# Patient Record
Sex: Male | Born: 1950 | Race: White | Hispanic: No | Marital: Married | Smoking: Former smoker
Health system: Southern US, Community
[De-identification: ages and names within clinical notes are randomized; demographics above are authoritative.]

## PROBLEM LIST (undated history)

## (undated) DIAGNOSIS — I714 Abdominal aortic aneurysm, without rupture, unspecified: Secondary | ICD-10-CM

## (undated) DIAGNOSIS — R7989 Other specified abnormal findings of blood chemistry: Secondary | ICD-10-CM

## (undated) DIAGNOSIS — H3554 Dystrophies primarily involving the retinal pigment epithelium: Secondary | ICD-10-CM

## (undated) DIAGNOSIS — M1990 Unspecified osteoarthritis, unspecified site: Secondary | ICD-10-CM

## (undated) DIAGNOSIS — G8929 Other chronic pain: Secondary | ICD-10-CM

## (undated) DIAGNOSIS — I8390 Asymptomatic varicose veins of unspecified lower extremity: Secondary | ICD-10-CM

## (undated) DIAGNOSIS — R002 Palpitations: Secondary | ICD-10-CM

## (undated) DIAGNOSIS — Z87442 Personal history of urinary calculi: Secondary | ICD-10-CM

## (undated) DIAGNOSIS — H3530 Unspecified macular degeneration: Secondary | ICD-10-CM

## (undated) DIAGNOSIS — E119 Type 2 diabetes mellitus without complications: Secondary | ICD-10-CM

## (undated) DIAGNOSIS — M79673 Pain in unspecified foot: Secondary | ICD-10-CM

## (undated) DIAGNOSIS — M545 Other chronic pain: Secondary | ICD-10-CM

## (undated) DIAGNOSIS — K219 Gastro-esophageal reflux disease without esophagitis: Secondary | ICD-10-CM

## (undated) DIAGNOSIS — I10 Essential (primary) hypertension: Secondary | ICD-10-CM

## (undated) DIAGNOSIS — D751 Secondary polycythemia: Secondary | ICD-10-CM

## (undated) DIAGNOSIS — F419 Anxiety disorder, unspecified: Secondary | ICD-10-CM

## (undated) HISTORY — DX: Other chronic pain: G89.29

## (undated) HISTORY — DX: Anxiety disorder, unspecified: F41.9

## (undated) HISTORY — DX: Gastro-esophageal reflux disease without esophagitis: K21.9

## (undated) HISTORY — DX: Other chronic pain: M54.50

## (undated) HISTORY — DX: Secondary polycythemia: D75.1

## (undated) HISTORY — DX: Unspecified macular degeneration: H35.30

## (undated) HISTORY — DX: Abdominal aortic aneurysm, without rupture, unspecified: I71.40

## (undated) HISTORY — DX: Other specified abnormal findings of blood chemistry: R79.89

## (undated) HISTORY — PX: ROTATOR CUFF REPAIR: SHX139

## (undated) HISTORY — DX: Pain in unspecified foot: M79.673

## (undated) HISTORY — DX: Palpitations: R00.2

## (undated) HISTORY — PX: OTHER SURGICAL HISTORY: SHX169

## (undated) HISTORY — DX: Essential (primary) hypertension: I10

## (undated) HISTORY — PX: TYMPANOPLASTY: SHX33

## (undated) HISTORY — DX: Abdominal aortic aneurysm, without rupture: I71.4

## (undated) HISTORY — DX: Asymptomatic varicose veins of unspecified lower extremity: I83.90

---

## 1970-01-15 HISTORY — PX: OTHER SURGICAL HISTORY: SHX169

## 2004-01-04 ENCOUNTER — Ambulatory Visit: Payer: Self-pay | Admitting: Family Medicine

## 2004-01-05 ENCOUNTER — Ambulatory Visit: Payer: Self-pay | Admitting: Cardiology

## 2004-04-27 ENCOUNTER — Ambulatory Visit: Payer: Self-pay | Admitting: Family Medicine

## 2004-09-13 ENCOUNTER — Observation Stay (HOSPITAL_COMMUNITY): Admission: AD | Admit: 2004-09-13 | Discharge: 2004-09-15 | Payer: Self-pay | Admitting: Cardiology

## 2004-09-13 ENCOUNTER — Ambulatory Visit: Payer: Self-pay | Admitting: Cardiology

## 2004-09-15 ENCOUNTER — Encounter: Payer: Self-pay | Admitting: Cardiology

## 2004-09-20 ENCOUNTER — Ambulatory Visit: Payer: Self-pay | Admitting: Family Medicine

## 2004-10-13 ENCOUNTER — Ambulatory Visit: Payer: Self-pay | Admitting: Cardiology

## 2004-10-18 ENCOUNTER — Ambulatory Visit: Admission: RE | Admit: 2004-10-18 | Discharge: 2004-10-18 | Payer: Self-pay | Admitting: Cardiology

## 2004-11-07 ENCOUNTER — Ambulatory Visit: Payer: Self-pay | Admitting: Family Medicine

## 2004-11-16 ENCOUNTER — Ambulatory Visit: Payer: Self-pay | Admitting: Family Medicine

## 2004-12-04 ENCOUNTER — Ambulatory Visit: Payer: Self-pay | Admitting: Family Medicine

## 2004-12-13 ENCOUNTER — Ambulatory Visit: Payer: Self-pay | Admitting: Internal Medicine

## 2004-12-27 ENCOUNTER — Ambulatory Visit: Payer: Self-pay | Admitting: Internal Medicine

## 2004-12-27 ENCOUNTER — Ambulatory Visit (HOSPITAL_COMMUNITY): Admission: RE | Admit: 2004-12-27 | Discharge: 2004-12-27 | Payer: Self-pay | Admitting: Internal Medicine

## 2005-01-15 HISTORY — PX: ABDOMINAL AORTIC ANEURYSM REPAIR: SUR1152

## 2005-01-16 ENCOUNTER — Ambulatory Visit: Payer: Self-pay | Admitting: Family Medicine

## 2005-03-07 ENCOUNTER — Ambulatory Visit: Payer: Self-pay | Admitting: Family Medicine

## 2005-03-26 ENCOUNTER — Encounter: Admission: RE | Admit: 2005-03-26 | Discharge: 2005-03-26 | Payer: Self-pay | Admitting: Vascular Surgery

## 2005-07-11 ENCOUNTER — Ambulatory Visit: Payer: Self-pay | Admitting: Family Medicine

## 2005-11-29 DIAGNOSIS — C4491 Basal cell carcinoma of skin, unspecified: Secondary | ICD-10-CM

## 2005-11-29 HISTORY — DX: Basal cell carcinoma of skin, unspecified: C44.91

## 2006-01-15 HISTORY — PX: ABDOMINAL AORTIC ANEURYSM REPAIR: SUR1152

## 2006-04-23 ENCOUNTER — Ambulatory Visit: Payer: Self-pay | Admitting: Family Medicine

## 2006-07-12 ENCOUNTER — Encounter: Admission: RE | Admit: 2006-07-12 | Discharge: 2006-07-12 | Payer: Self-pay | Admitting: Orthopedic Surgery

## 2006-07-25 ENCOUNTER — Ambulatory Visit (HOSPITAL_COMMUNITY): Admission: RE | Admit: 2006-07-25 | Discharge: 2006-07-26 | Payer: Self-pay | Admitting: Orthopedic Surgery

## 2006-10-25 ENCOUNTER — Ambulatory Visit: Payer: Self-pay | Admitting: Vascular Surgery

## 2007-03-11 ENCOUNTER — Encounter: Admission: RE | Admit: 2007-03-11 | Discharge: 2007-06-09 | Payer: Self-pay | Admitting: Orthopedic Surgery

## 2007-05-06 ENCOUNTER — Ambulatory Visit: Payer: Self-pay | Admitting: Vascular Surgery

## 2007-06-10 ENCOUNTER — Encounter: Admission: RE | Admit: 2007-06-10 | Discharge: 2007-08-19 | Payer: Self-pay | Admitting: Orthopedic Surgery

## 2010-05-30 NOTE — Op Note (Signed)
Peter [name], Peter [name]               ACCOUNT NO.:  [id number]   MEDICAL RECORD NO.:  [id number]          PATIENT TYPE:  AMB   LOCATION:  DAY                          FACILITY:  [facility]   PHYSICIAN:  Georges Lynch. Gioffre, M.D.DATE OF BIRTH:  [birth year]   DATE OF PROCEDURE:  07/25/2006  DATE OF DISCHARGE:                               OPERATIVE REPORT   SURGEON:  Georges Lynch. Darrelyn Hillock, M.D.   ASSISTANT:  Jamelle Rushing, P.A.   PREOP DIAGNOSIS:  Complete retracted tear of the right rotator cuff  tendon.   POSTOP DIAGNOSIS:  Complete retracted tear of the right rotator cuff  tendon.   OPERATION:  1. A partial acromionectomy and an acromioplasty, right shoulder.  2. A repair of a complete retracted tear of the rotator cuff tendon      utilizing a Restore tendon graft with two 4-prong Mitek metal      anchors.   PROCEDURE:  The patient first had an interscalene nerve block; then he  was brought back to surgery.  A sterile prepping and draping of the  right shoulder was carried out.  He had 1 gram of IV Ancef preop.  At  this time an incision was made over the anterior aspect of the right  shoulder.  Bleeders were identified and cauterized.  I separated the  deltoid tendon from the acromion in the usual fashion by sharp  dissection.  I then went down and identified the acromion.  His acromion  was very thickened and overgrown and downsloping.  I protected the  underlying rotator cuff with a Bennett retractor; and then did a partial  acromionectomy with the oscillating saw and then utilized the bur to  even out the undersurface of the acromion.  Following that I thoroughly  irrigated out the area.  I identified the cuff, the cup was completely  torn; it was a severe highlight tear with retraction.  I utilized a bur  to bur down the lateral articular surface of the humeral head.  Two  anchors were placed in the proximal humerus.  They were 4-prong Mitek  anchors.  I then grasped both ends of the  tendon, medial and lateral,  brought those together and sutured those first.  I did a primary  suturing repair.  Following that I then utilized the restore graft to  oversew the repair site and then anchored that down with the suture  anchors.   I thoroughly irrigated out the area.  Gelfoam was placed in the  shoulder.  I then reattached the deltoid tendon and muscle in the usual  fashion.  The skin was closed with metal staples.  A sterile Neosporin  dressing was applied.  He was placed in the shoulder immobilizer.           ______________________________  Georges Lynch Darrelyn Hillock, M.D.     RAG/MEDQ  D:  07/25/2006  T:  07/26/2006  Job:  [id number]

## 2010-05-30 NOTE — Procedures (Signed)
DUPLEX ULTRASOUND OF ABDOMINAL AORTA   INDICATION:  Followup, abdominal aortic aneurysm.   HISTORY:  Diabetes:  No.  Cardiac:  No.  Hypertension:  Yes.  Smoking:  Quit.  Connective Tissue Disorder:  Family History:  Yes.  Previous Surgery:  No.   DUPLEX EXAM:         AP (cm)                   TRANSVERSE (cm)  Proximal             3.96 Cm                   4.0 cm  Mid                  5.05 cm                   5.15 cm  Distal               2.00 cm                   2.15 cm  Right Iliac          1.20 cm                   1.19 cm  Left Iliac           1.28 cm                   1.13 cm   PREVIOUS:  Date: 09/24/2005  AP:  4.3  TRANSVERSE:  4.4.   IMPRESSION:  1. Abdominal aortic aneurysm measurements have increased from previous      study.  2. Proximal aorta was technically difficult to image due to bowel gas.  3. Dr. Arbie Cookey is seeing the patient today.   ___________________________________________  Larina Earthly, M.D.   AS/MEDQ  D:  10/25/2006  T:  10/26/2006  Job:  [id number]

## 2010-05-30 NOTE — Assessment & Plan Note (Signed)
OFFICE VISIT   Peter [name], Peter [name]  DOB:  [birth year]                                       10/25/2006  ZOXWR#:[id number]   Patient presents today for continued followup of his known infrarenal  abdominal aortic aneurysm.  He has had no symptoms related to this.  He  has multiple new medical problems.  He has continued difficulty with  vision and now is being seen at the [facility] for  hemoglobin of 19.  Also reports that his testosterone level is extremely  low and has begun treatment with topical testosterone.  He had just  generalized malaise.   PHYSICAL EXAMINATION:  This is a well-developed and well-nourished white  male appearing his stated age of 70.  Blood pressure is 151/100, pulse  77, respirations 18, 97% oxygen saturation.  He has stiffness in his  right shoulder and is unable to raise this due to an injury.  His  abdominal exam reveals no tenderness.  He does have a prominent aortic  pulsation.  He has 2+ femoral, 2+ popliteal, and 2+ dorsalis pedis  pulses bilaterally.   He underwent a repeat noninvasive vascular laboratory study today, and  this reveals ultrasound of his aorta with increase in size.   His last ultrasound one year ago was maximum diameter of 4.4 cm.  Today  it is a maximum diameter of 5.1 cm.  I discussed this at length with  patient.  I explained that this does put him at increased risk for  rupture of his aneurysm.  He reports with all of his other medical  issues, he is unable to proceed with any consideration of elective  repair at this time.  I explained that his annual risk would be  approximately 5% for aneurysm rupture with his 5 cm aneurysm.  I did  explain that he has shown grown over one year.  He will see me again at  six months for repeat ultrasound.  I explained that if he does show  significant increased growth over that time, that we would strongly  recommend repair.  I again reviewed symptoms of  leaking aneurysm with  him,  and he will notify me should this occur.  Otherwise, we will see him in  six months with repeat ultrasound.   Larina Earthly, M.D.  Electronically Signed   TFE/MEDQ  D:  10/25/2006  T:  10/28/2006  Job:  [id number]   cc:   Delaney Meigs, M.D.  Boris M. Darovsky, M.D.

## 2010-06-02 NOTE — Discharge Summary (Signed)
[name], [name] NO.:  [id number]   MEDICAL RECORD NO.:  [id number]          PATIENT TYPE:  INP   LOCATION:  3711                         FACILITY:  [facility]   PHYSICIAN:  Jonelle Sidle, M.D. LHCDATE OF BIRTH:  June 16, 1950   DATE OF ADMISSION:  09/13/2004  DATE OF DISCHARGE:  09/15/2004                           DISCHARGE SUMMARY - REFERRING   HISTORY OF PRESENT ILLNESS:  Peter [name] is a 60 year old white male who was  transferred from [facility] for evaluation of chest discomfort.  He  described a three day history of an annoying discomfort he described as a  fullness in his upper chest which was intermittent, worse with activity,  associated with diaphoresis, shortness of breath, nausea, light headedness,  with blurred vision.  He has also noticed some dysphagia for the last three  months without any signs of increased reflux.   PAST MEDICAL HISTORY:  Notable for anxiety, vertigo, kidney stones, gout,  palpitations, hypertension since age 68, past abdominal aortic aneurysm, and  tobacco use.   LABORATORY DATA:  Chest x-ray on August 30 showed left basilar atelectasis.  Ultrasound of the abdomen showed abdominal aortic aneurysm with maximum AP  and transverse diameter of 3.4 by 4.5 cm, length was approximately 8.2, both  common iliacs were dilated at 1.3.  I called [facility] for comparison with their  last duplex scan that was performed in 2005.  At that time, it measured 3.4  by 3.59 with right iliac of 1.01 and left iliac of 1.23.  EKG showed sinus  bradycardia, normal axis, early repolarization.  Labs at [facility]  showed H&H of 17 and 49.4, normal indices, platelets 285, WBC 7.7.  Sodium  137, potassium 3.5, BUN 14, creatinine 1.1, glucose 100.  PT 12.5, PTT 28.6.  Initial CK, MB, relative index, and troponin were negative.   [facility] admission weight was 221 pounds, subsequent H&H were  unremarkable, as were his PT and PTT.   D-dimer on August 31 was less than  0.22.  On transfer on August 30, potassium was low at 3.9, he also had  normal LFTs.  On the morning of discharge, sodium is 139, potassium 3.6, BUN  13, creatinine 1.2.  Hemoglobin A1C was 5.5.  Subsequent CK, MB, relative  index, and troponins were negative x 2.  Fasting lipids showed a total  cholesterol 164, triglycerides 183, HDL 28, LDL 99, TSH 1.142.   HOSPITAL COURSE:  Peter [name] was admitted to 3700.  Overnight, he did not  have any further chest discomfort and he had ruled out for myocardial  infarction.  Catheterization performed on August 31 by Dr. Riley Kill revealed  a normal ejection fraction and a 20-30% proximal LAD, moderate to large  abdominal aortic aneurysm was also visualized.  Dr. Riley Kill advised that the  patient should discontinue smoking.  Post catheterization, the patient was  ambulating without difficulty and the catheterization site was intact.  Prior to discharge, an echocardiogram was performed.  This revealed an  ejection fraction of 50-65%, findings suggestive of inferoposterior  hypokinesis, mild LVH, mild MR, bilateral mild  atrial enlargement, mild  right ventricular enlargement.  Abdominal ultrasound as previously described  was also performed.  The echocardiogram and abdominal ultrasound were  reviewed with Dr. Diona Browner and it was felt that the patient could be  discharged home.   DISCHARGE DIAGNOSIS:  1.  Atypical chest discomfort with nonobstructive coronary artery disease on      cardiac catheterization and a normal ejection fraction by      catheterization and echocardiogram.  2.  Hyperlipidemia.  3.  Tobacco use.  4.  Abdominal aortic aneurysm.  5.  Dysphagia.  6.  History as previous.   DISPOSITION:  The patient is discharged home.   DISCHARGE MEDICATIONS:  Aspirin 81 mg daily, Hyzaar 50/12.5 daily,  Wellbutrin 50 daily, Meclizine as needed.  New medications include Zocor 20  mg q.h.s. and Protonix 40 mg  p.o. daily.  He will follow up with Dr.  Diona Browner in the [place] office on October 2 at 2 p.m.  He was advised no  smoking.  He also received discharge instructions after cardiac  catheterization in regards to activities and slight care.  Just prior to  discharge, he also received written material about discontinuing smoking and  hyperlipidemia.  At the time of follow up with Dr. Diona Browner, consideration  should be given to rechecking fasting lipids and LFTs in approximately eight  weeks since his discharge and to reinforce tobacco cessation.      Peter [name], P.A. [facility]    ______________________________  Jonelle Sidle, M.D. [facility]    EW/MEDQ  D:  09/15/2004  T:  09/15/2004  Job:  [id number]   cc:   Peter [name], M.D.  [address]  [place]  [place] [address]   Peter [name], M.D.  [address]  [place]  [place] [address]  Fax: [phone]

## 2010-06-02 NOTE — H&P (Signed)
Peter [name], Peter [name]               ACCOUNT NO.:  [id number]   MEDICAL RECORD NO.:  [id number]          PATIENT TYPE:  AMB   LOCATION:                                FACILITY:  [facility]   PHYSICIAN:  R. Roetta Sessions, M.D.      DATE OF BIRTH:   DATE OF ADMISSION:  DATE OF DISCHARGE:  LH                                HISTORY & PHYSICAL   REASON FOR CONSULTATION:  Chest pain and dysphagia.   HISTORY OF PRESENT ILLNESS:  This patient is a pleasant, 60 year old  Caucasian male sent over at the courtesy of [facility] in [place] to further evaluate intermittent chest pain and some  typical reflux symptoms. He ultimately was seen by [facility] and  recently underwent a catheterization; and he was basically found to have no  significant coronary disease, per his report.   He does have a long history of more typically of gastroesophageal reflux  disease symptoms and has had some bleeding with retrosternal chest pain  which is different from reflux, recently.  He also describes chronic  esophageal dysphagia to solids, and has had transient food impactions  particularly when he eats out from time-to-time, overall, over the last  several years.  He has never had any imaging of his upper GI tract in the  way of barium studies or upper endoscopy.  He does not have odynophagia.  He  does not use alcohol or tobacco (he quit smoking several months ago).  He  occasionally has some typical regurgitation but does not have much in the  way of typical heartburn.  He had been on Prilosec, but the cardiologist put  him on Nexium 40 mg orally b.i.d. a couple of weeks ago.   PAST MEDICAL HISTORY:  1.  Significant for hypertension.  2.  Chronic ear problems.  3.  History of a AAA, reportedly small.  He is being followed by Dr. Gretta Began.   PAST SURGERIES:  Bilateral mastoid surgery and cardiac catheterization and  kidney stone surgery.  He has seen Dr. Jerre Simon  previously.   He tells me that he was hospitalized for two weeks in a row in [place] some  25 years ago with nausea, vomiting, diarrhea, weight loss and was finally  diagnosed with Giardia.   CURRENT MEDICATIONS:  1.  Over-the-counter __________ and B50 daily, herbal vitamin supplements,      fish oil 300 mg daily, __________ 150 mg daily.  2.  Zoloft 50 mg 1/2 tablet daily.  3.  Lorazepam 0.5 mg q.h.s.  4.  Zocor once daily.  5.  Nexium 40 mg orally b.i.d.  6.  Hyzaar 100/25 one-half tablet daily.  7.  ASA 81 mg daily.  8.  Erythromycin 500 mg orally b.i.d..  He has one more tablet to finish his      therapy.   ALLERGIES:  PENICILLIN.   FAMILY HISTORY:  All of his siblings have had some sort of heart disease,  catheterization, bypass surgery  No history of chronic GI or liver illness.  SOCIAL HISTORY:  The patient is married and has two children.  He works for  [employer].  He is a former smoker, no alcohol.   REVIEW OF SYSTEMS:  No recent chest pain on exertion.  No dyspnea.  No fever  or chills.  Otherwise as in history of present illness.   PHYSICAL EXAMINATION:  GENERAL:  Reveals a pleasant, 60 year old gentleman  resting comfortably.  VITAL SIGNS:  Weight 225. Height 6 feet 2 inches.  Temperature 97.6, BP  130/80, pulse 68.  SKIN:  Warm and dry.  No jaundice.  No cutaneous stigmata of chronic liver  disease.  HEENT:  No scleral icterus.  NECK:  JVD is not prominent.  CHEST:  Lungs are clear to auscultation.  CARDIAC:  Regular rate and rhythm without murmur, gallop, or rub.  ABDOMEN:  Nondistended, positive bowel sounds.  Soft, nontender, without  appreciable mass or organomegaly.  EXTREMITIES:  Have no edema.   IMPRESSION:  This patient is a 60 year old gentleman with a history of  background symptoms consistent with gastroesophageal reflux disease, some  atypical chest pain recently.  It is good to know that cardiac  catheterization was pretty much  okay.   He describes esophageal dysphagia to solids which has been ongoing for some  time which likely is representative of a ring, stricture, or possibly a web.  His typical reflux symptoms have been pretty well squelched on Nexium 40 mg  orally b.i.d.   RECOMMENDATIONS:  The patient needs an EGD to further evaluate his dysphagia  and atypical chest pain.  This approach has been discussed with the patient  and the potential risks, benefits, and alternatives have been reviewed,  questions answered, he is agreeable.  We will plan to perform an EGD with  possible esophageal diltation in the very near future.  We will make further  recommendations after endoscopic evaluation has taken place.      Jonathon Bellows, M.D.  Electronically Signed     RMR/MEDQ  D:  12/13/2004  T:  12/13/2004  Job:  [id number]   cc:   Delaney Meigs, M.D.  Fax: [phone]

## 2010-06-02 NOTE — Cardiovascular Report (Signed)
NAMEALEXUS, Peter [name] NO.:  [id number]   MEDICAL RECORD NO.:  [id number]          PATIENT TYPE:  INP   LOCATION:  3711                         FACILITY:  [facility]   PHYSICIAN:  Arturo Morton. Riley Kill, M.D. [facility] OF BIRTH:  05-16-1950   DATE OF PROCEDURE:  09/14/2004  DATE OF DISCHARGE:                              CARDIAC CATHETERIZATION   INDICATIONS:  The patient has had significant shortness of breath and chest  pain.  He presents for evaluation.  He was seen by Dr. Diona Browner and set up  for cardiac catheterization.   PROCEDURE:  1.  Left heart catheterization.  2.  Selective coronary arteriography.  3.  Selective left ventriculography.  4.  Distal aortography.   DESCRIPTION OF PROCEDURE:  The patient was brought to the catheterization  laboratory after informed consent.  He was prepped and draped in the usual  fashion.  Through an puncture, the right femoral artery was easily entered  and a 6-French sheath was placed.  Views of the left and right coronary  arteries were obtained in multiple angiographic projections.  Central aortic  and left ventricular pressures were measured with a pigtail.  Ventriculography was performed in the RAO projection.  Distal aortography  was then performed to evaluate his abdominal aortic aneurysm that is known.   He tolerated the procedure well and was taken to the holding area in  satisfactory clinical condition.  ACT was appropriate for sheath removal.   HEMODYNAMIC DATA:  1.  Central aortic pressure 123/81, mean 100.  2.  Left ventricular pressure 110/14.  3.  No gradient on pullback across the aortic valve.   ANGIOGRAPHIC DATA:  1.  Ventriculography was performed in the RAO projection.  Post PVC beats      were used to analyze overall LV function.  Overall systolic function      appeared to be well-preserved.  There did not appear to be significant      mitral regurgitation noted.  2.  Distal aortography revealed what  appeared to be patent renal arteries.      There is a moderate the large abdominal aortic aneurysm that extends and      ends before the bifurcation.  3.  The left main is free of critical disease.  4.  The LAD has about 20% to 30% segmental narrowing beyond the origin of      the diagonal.  The diagonal is large.  The distal LAD is without      critical disease.  5.  The circumflex provides a large marginal without significant narrowing.      The right coronary provides a small PDA and posterolateral system.      There is mild luminal irregularity throughout the RCA, but without      critical narrowing.   CONCLUSIONS:  1.  Well-preserved left ventricular function.  2.  Mild irregularity of the mid left anterior descending artery without      critical focal narrowing.  3.  No critical disease of the circumflex or right coronary artery.  4.  Moderate-sized abdominal aortic aneurysm.  PLAN:  1.  Discontinue smoking.  2.  Check D-dimer.  3.  Discontinue heparin.  4.  Two-dimensional echocardiogram.  5.  Get chest x-ray results.  6.  Follow up with Dr. Diona Browner and Dr. Lysbeth Galas.      Arturo Morton. Riley Kill, M.D. [facility]  Electronically Signed     TDS/MEDQ  D:  09/14/2004  T:  09/14/2004  Job:  [id number]

## 2010-06-02 NOTE — Assessment & Plan Note (Signed)
OFFICE VISIT   Peter [name], Peter [name]  DOB:  Nov 12, 1950                                       11/29/2006  CHART#:[id number]   I left a message with the patient's answering machine at his home and I  have attempted to contact him at work today on 11/14.  He had contacted  our office while I was gone on vacation last week stating he had an  ultrasound performed at [facility] with an aneurysm measuring 9.4  cm.  Dr. Darrick Penna was asked to review this in my absence, and this  actually showed that the length was 9.4 but the diameter was 5.7x6.0.  He was explained that this was not urgent and had been suggested that he  follow up with me for further discussion.  Apparently, he has discussed  with our nursing staff and [facility] that he may schedule an appointment at  [facility] for further evaluation.  I attempted to discuss this with him  today.  I left a message and asked him to call us, explained that we  would recommend a CAT scan to explain the discrepancy of the [facility]  study of a 6 cm diameter versus our study on 10/25/2006 suggesting a 5.1  cm diameter aneurysm.  He has no symptoms referable to his aorta, and we  will attempt to contact him for further discussion.   Larina Earthly, M.D.  Electronically Signed   TFE/MEDQ  D:  11/29/2006  T:  12/02/2006  Job:  [id number]

## 2010-09-26 ENCOUNTER — Ambulatory Visit: Payer: PRIVATE HEALTH INSURANCE | Attending: Neurology | Admitting: Physical Therapy

## 2010-09-26 DIAGNOSIS — IMO0001 Reserved for inherently not codable concepts without codable children: Secondary | ICD-10-CM | POA: Insufficient documentation

## 2010-09-26 DIAGNOSIS — M25676 Stiffness of unspecified foot, not elsewhere classified: Secondary | ICD-10-CM | POA: Insufficient documentation

## 2010-09-26 DIAGNOSIS — M25673 Stiffness of unspecified ankle, not elsewhere classified: Secondary | ICD-10-CM | POA: Insufficient documentation

## 2010-09-26 DIAGNOSIS — R5381 Other malaise: Secondary | ICD-10-CM | POA: Insufficient documentation

## 2010-09-26 DIAGNOSIS — M25579 Pain in unspecified ankle and joints of unspecified foot: Secondary | ICD-10-CM | POA: Insufficient documentation

## 2010-10-02 ENCOUNTER — Ambulatory Visit: Payer: PRIVATE HEALTH INSURANCE | Admitting: Physical Therapy

## 2010-10-06 ENCOUNTER — Ambulatory Visit: Payer: PRIVATE HEALTH INSURANCE | Admitting: *Deleted

## 2010-10-10 ENCOUNTER — Ambulatory Visit: Payer: PRIVATE HEALTH INSURANCE | Admitting: *Deleted

## 2010-10-13 ENCOUNTER — Ambulatory Visit: Payer: PRIVATE HEALTH INSURANCE | Admitting: *Deleted

## 2010-10-17 ENCOUNTER — Ambulatory Visit: Payer: Medicare Other | Attending: Neurology | Admitting: Physical Therapy

## 2010-10-17 DIAGNOSIS — M25676 Stiffness of unspecified foot, not elsewhere classified: Secondary | ICD-10-CM | POA: Insufficient documentation

## 2010-10-17 DIAGNOSIS — R5381 Other malaise: Secondary | ICD-10-CM | POA: Insufficient documentation

## 2010-10-17 DIAGNOSIS — M25673 Stiffness of unspecified ankle, not elsewhere classified: Secondary | ICD-10-CM | POA: Insufficient documentation

## 2010-10-17 DIAGNOSIS — IMO0001 Reserved for inherently not codable concepts without codable children: Secondary | ICD-10-CM | POA: Insufficient documentation

## 2010-10-17 DIAGNOSIS — M25579 Pain in unspecified ankle and joints of unspecified foot: Secondary | ICD-10-CM | POA: Insufficient documentation

## 2010-10-23 ENCOUNTER — Other Ambulatory Visit: Payer: Self-pay

## 2010-10-23 DIAGNOSIS — I83893 Varicose veins of bilateral lower extremities with other complications: Secondary | ICD-10-CM

## 2010-10-27 ENCOUNTER — Encounter: Payer: Self-pay | Admitting: Vascular Surgery

## 2010-10-31 LAB — BASIC METABOLIC PANEL
BUN: 9
CO2: 28
Calcium: 10
Chloride: 101
Creatinine, Ser: 0.95
GFR calc Af Amer: >60
GFR calc non Af Amer: >60
Glucose, Bld: 120 — ABNORMAL HIGH
Potassium: 3.9
Sodium: 138

## 2010-10-31 LAB — HEMOGLOBIN AND HEMATOCRIT, BLOOD
HCT: 52.9 — ABNORMAL HIGH
Hemoglobin: 18.4 — ABNORMAL HIGH

## 2010-10-31 LAB — APTT: aPTT: 31

## 2010-10-31 LAB — PROTIME-INR
INR: 1
Prothrombin Time: 12.8

## 2010-12-14 ENCOUNTER — Encounter: Payer: Self-pay | Admitting: Vascular Surgery

## 2010-12-15 ENCOUNTER — Ambulatory Visit (INDEPENDENT_AMBULATORY_CARE_PROVIDER_SITE_OTHER): Payer: PRIVATE HEALTH INSURANCE | Admitting: Vascular Surgery

## 2010-12-15 ENCOUNTER — Encounter: Payer: Self-pay | Admitting: Vascular Surgery

## 2010-12-15 VITALS — BP 153/93 | HR 70 | Resp 16 | Ht 73.0 in | Wt 239.0 lb

## 2010-12-15 DIAGNOSIS — I872 Venous insufficiency (chronic) (peripheral): Secondary | ICD-10-CM

## 2010-12-15 DIAGNOSIS — I83893 Varicose veins of bilateral lower extremities with other complications: Secondary | ICD-10-CM

## 2010-12-15 DIAGNOSIS — M7989 Other specified soft tissue disorders: Secondary | ICD-10-CM

## 2010-12-15 DIAGNOSIS — M79609 Pain in unspecified limb: Secondary | ICD-10-CM

## 2010-12-15 NOTE — Progress Notes (Signed)
[facility] [place]  Referred by:  Harlon Ditty Nyland [address] [facility] [place], [place] [address]  Reason for referral: Bilateral painful swollen legs  History of Present Illness  Peter [name] is a 60 y.o. male who presents with chief complaint: swollen leg.  Patient notes, onset of swelling years ago, associated with continued ambulation.  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  He has a bursting sensation as the day progresses.  he patient has had no history of DVT, known history of varicose vein, no history of venous stasis ulcers, no history of  Lymphedema and history of skin changes in lower legs.  There is no family history of venous disorders.  The patient has had used compression stockings in the past.  Past Medical History  Diagnosis Date  . AAA (abdominal aortic aneurysm)   . Macular degeneration   . Anxiety   . GERD (gastroesophageal reflux disease)   . Palpitations   . Foot pain   . Varicose veins     Past Surgical History  Procedure Date  . Abdominal aortic aneurysm repair 2008  . Rotator cuff repair     History   Social History  . Marital Status: Married    Spouse Name: N/A    Number of Children: N/A  . Years of Education: N/A   Occupational History  . Not on file.   Social History Main Topics  . Smoking status: Never Smoker   . Smokeless tobacco: Not on file  . Alcohol Use:   . Drug Use:   . Sexually Active:    Other Topics Concern  . Not on file   Social History Narrative  . No narrative on file    No family history on file.  Current Outpatient Prescriptions on File Prior to Visit  Medication Sig Dispense Refill  . amLODipine-olmesartan (AZOR) 5-20 MG per tablet Take 1 tablet by mouth daily.        . hydrochlorothiazide (MICROZIDE) 12.5 MG capsule Take 12.5 mg by mouth daily.        Marland Kitchen LORazepam (ATIVAN) 0.5 MG tablet Take 0.5 mg by mouth  every 8 (eight) hours.        . Omeprazole Magnesium (PRILOSEC OTC PO) Take by mouth.        . sertraline (ZOLOFT) 50 MG tablet Take 50 mg by mouth daily.        . Cholecalciferol (VITAMIN D) 2000 UNITS CAPS Take 1 capsule by mouth daily.          Allergies  Allergen Reactions  . Penicillins      Review of Systems (Positive items checked otherwise negative)  General: [ ]  Weight loss, [x]  Weight gain, [ ]   Loss of appetite, [ ]  Fever  Neurologic: [ ]  Dizziness, [ ]  Blackouts, [ ]  Headaches, [ ]  Seizure  Ear/Nose/Throat: [x]  Change in eyesight, [ ]  Change in hearing, [ ]  Nose bleeds, [ ]  Sore throat  Vascular: [x]  Pain in legs with walking, [ ]  Pain in feet while lying flat, [ ]  Non-healing ulcer, Stroke, [ ]  "Mini stroke", [ ]  Slurred speech, [ ]  Temporary blindness, [ ]  Blood clot in vein, [ ]  Phlebitis  Pulmonary: [ ]  Home oxygen, [ ]  Productive cough, [ ]  Bronchitis, [ ]  Coughing up blood,  [ ]  Asthma, [ ]  Wheezing  Musculoskeletal: [x]  Arthritis, [x]  Joint pain, [x]  Muscle pain  Cardiac: [ ]   Chest pain, [ ]  Chest tightness/pressure, [ ]  Shortness of breath when lying flat, [ ]  Shortness of breath with exertion, [ ]  Palpitations, [ ]  Heart murmur, [ ]  Arrythmia,  [ ]  Atrial fibrillation  Hematologic: [ ]  Bleeding problems, [ ]  Clotting disorder, [ ]  Anemia  Psychiatric:  [ ]  Depression, [x]  Anxiety, [ ]  Attention deficit disorder  Gastrointestinal:  [ ]  Black stool,[ ]   Blood in stool, [ ]  Peptic ulcer disease, [ ]  Reflux, [ ]  Hiatal hernia, [ ]  Trouble swallowing, [ ]  Diarrhea, [ ]  Constipation  Urinary:  [ ]  Kidney disease, [ ]  Burning with urination, [ ]  Frequent urination, [ ]  Difficulty urinating  Skin: [ ]  Ulcers, [ ]  Rashes   Physical Examination  Filed Vitals:   12/15/10 1041  BP: 153/93  Pulse: 70  Resp: 16  Height: 6\' 1"  (1.854 m)  Weight: 239 lb (108.41 kg)  SpO2: 95%   Body mass index is 31.53 kg/(m^2).   General: A&O x 3, WDWN, mildly  obese  Head: [place]/AT  Ear/Nose/Throat: Hearing grossly intact, nares w/o erythema or drainage, oropharynx w/o Erythema/Exudate  Eyes: PERRLA, EOMI  Neck: Supple, no nuchal rigidity, no palpable LAD  Pulmonary: Sym exp, good air movt, CTAB, no rales, rhonchi, & wheezing  Cardiac: RRR, Nl S1, S2, no Murmurs, rubs or gallops  Vascular: Vessel Right Left  Radial Palpable Palpable  Brachial Palpable Palpable  Carotid Palpable, without bruit Palpable, without bruit  Aorta Non-palpable N/A  Femoral Palpable Palpable  Popliteal Non-palpable Non-palpable  PT Palpable Palpable  DP Palpable Palpable   Gastrointestinal: soft, NTND, -G/R, - HSM, - masses, - CVAT B  Musculoskeletal: M/S 5/5 throughout , Extremities without ischemic changes , extensive large varicosities in both legs (L>>R), extends to posterior calf on left side, mild lipodermatosclerosis bilaterally  Neurologic: CN 2-12 intact , Pain and light touch intact in extremities , Motor exam as listed above  Psychiatric: Judgment intact, Mood & affect appropriate for pt's clinical situation  Dermatologic: See M/S exam for extremity exam, no rashes otherwise noted  Lymph : No Cervical, Axillary, or Inguinal lymphadenopathy   Non-Invasive Vascular Imaging  BLE Venous Insufficiency Duplex (Date: 12/15/10):   RLE: reflux in knee and prox GSV, normal deep system  LLE: reflux throughout L GSV and LSV system, normal deep system  Medical Decision Making  Peter [name] is a 60 y.o. male who presents with: bilateral symptomatic chronic venous insufficienct (C4) not responsive to compression therapy.   Based on the patient's history and examination, I recommend: evaluation for endovenous ablation +/- stab phlebectomy L GSV (pt req Dr. Arbie Cookey).  I will arrange for evaluation in the vein clinic with Dr. Arbie Cookey  Thank you for allowing [name] to participate in this patient's care.  Leonides Sake, MD [facility]  [place] Office: [phone] Pager: [phone]  12/15/2010, 6:40 PM

## 2010-12-15 NOTE — Progress Notes (Signed)
BLE venous reflux duplex performed 12/15/2010@[facility] 

## 2010-12-18 NOTE — Procedures (Unsigned)
LOWER EXTREMITY VENOUS REFLUX EXAM  INDICATION:  Varicose veins, pain, swelling.  EXAM:  Using color-flow imaging and pulse Doppler spectral analysis, the bilateral common femoral, superficial femoral, popliteal, posterior tibial, greater and lesser saphenous veins are evaluated.  There is no evidence suggesting deep venous insufficiency in the bilateral lower extremities.  The bilateral saphenofemoral junction are not competent with reflux of >549milliseconds. The bilateral GSV's are not competent with reflux of >544milliseconds with the caliber as described below.   The right proximal short saphenous vein demonstrates competency.  The left proximal short saphenous vein demonstrates incompetency with diameter measurements ranging from 0.62 cm to 0.83 cm.  GSV Diameter (used if found to be incompetent only)                                                   Right      Left Proximal Greater Saphenous Vein                   0.77 cm    1.22 cm Proximal-to-mid-thigh                             0.68 cm    1.03 cm Mid thigh                                         0.59 cm    0.88 cm Mid-distal thigh Distal thigh                                      0.48 cm    0.71 cm Knee                                              0.53 cm    0.83 cm  IMPRESSION: 1. Bilateral great saphenous veins are not competent with reflux     >523milliseconds. 2. The bilateral great saphenous veins are not tortuous. 3. The deep venous system bilaterally is competent. 4. The right small saphenous vein is competent. 5. The left small saphenous vein is not competent with reflux >500     milliseconds.  ___________________________________________ Fransisco Hertz, MD  SH/MEDQ  D:  12/15/2010  T:  12/15/2010  Job:  [id number]

## 2010-12-26 ENCOUNTER — Other Ambulatory Visit: Payer: Self-pay | Admitting: *Deleted

## 2010-12-26 ENCOUNTER — Encounter (INDEPENDENT_AMBULATORY_CARE_PROVIDER_SITE_OTHER): Payer: PRIVATE HEALTH INSURANCE

## 2010-12-26 DIAGNOSIS — I83893 Varicose veins of bilateral lower extremities with other complications: Secondary | ICD-10-CM

## 2011-01-23 ENCOUNTER — Ambulatory Visit: Payer: PRIVATE HEALTH INSURANCE | Admitting: Vascular Surgery

## 2011-03-26 ENCOUNTER — Encounter: Payer: Self-pay | Admitting: Vascular Surgery

## 2011-03-27 ENCOUNTER — Ambulatory Visit: Payer: PRIVATE HEALTH INSURANCE | Admitting: Vascular Surgery

## 2011-04-09 ENCOUNTER — Encounter: Payer: Self-pay | Admitting: Vascular Surgery

## 2011-04-10 ENCOUNTER — Encounter: Payer: Self-pay | Admitting: Vascular Surgery

## 2011-04-10 ENCOUNTER — Ambulatory Visit (INDEPENDENT_AMBULATORY_CARE_PROVIDER_SITE_OTHER): Payer: PRIVATE HEALTH INSURANCE | Admitting: Vascular Surgery

## 2011-04-10 VITALS — BP 146/83 | HR 62 | Temp 97.7°F | Resp 18 | Ht 74.0 in | Wt 243.0 lb

## 2011-04-10 DIAGNOSIS — I83893 Varicose veins of bilateral lower extremities with other complications: Secondary | ICD-10-CM

## 2011-04-10 NOTE — Progress Notes (Addendum)
Problems with Activities of Daily Living Secondary to Leg Pain  1. 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   2. [name] has grandchildren and his leg pain hampers his ability to care for and play with his grandchildren.  [name], [name]   Failure of  Conservative Therapy:  1. Worn 20-30 mm Hg thigh high compression hose >3 months with no relief of symptoms.  2. Frequently elevates legs-no relief of symptoms  3. Taken Ibuprofen 600 Mg TID with no relief of symptoms.  The patient is clearly failed conservative treatment of his left leg venous hypertension. He has markedly and her enlarged tributary varicosities. He also has enlarged great and small saphenous vein on the left. He is much less distention on the right with some mild varicosities in his calf and I recommended continued observation of his right leg. He has worn graduated compression stockings since been fitted for them in 12/26/2010.  I did reimage his left veins with SonoSite the vein he does have enlarged saphenous vein worse reflux into these large tributary varicosities. He has marked reflux in his great and small saphenous. I have recommended staged left greater saphenous vein laser ablation and stab phlebectomy. Then a left small saphenous vein laser ablation. I would also recommend stab phlebectomy of his marked varicosities of her symptom relief. He understands this is an outpatient procedure under local anesthetic. We will schedule this at his earliest convenience.  I have known the patient from a prior followup of asymptomatic aneurysm. He did have elective repair of this at [facility] in 2008

## 2011-04-12 ENCOUNTER — Other Ambulatory Visit: Payer: Self-pay | Admitting: *Deleted

## 2011-04-12 DIAGNOSIS — I83893 Varicose veins of bilateral lower extremities with other complications: Secondary | ICD-10-CM

## 2011-05-09 ENCOUNTER — Telehealth: Payer: Self-pay | Admitting: *Deleted

## 2011-05-09 NOTE — Telephone Encounter (Signed)
Left detailed message for Mr. Dreisbach instructing him to call [employer] (primary insurance) and make them aware of his Medicare coverage (secondary insurance). Also encouraged Mr. Cimo to contact Medicare to determine if they [facility]) will pay/cover the 20% of eligible charges that [employer] does not cover for office surgery.  Informed Mr. Fabiano that he would be ultimately responsible for charges that were not covered by [employer] and Medicare.  Graham Hyun, [name]

## 2011-05-10 ENCOUNTER — Telehealth: Payer: Self-pay | Admitting: *Deleted

## 2011-05-10 NOTE — Telephone Encounter (Signed)
Mrs. Iser called earlier today and left a message with insurance issue questions.  Returned her call and explained to her that [employer] (primary insurance)  needed to know that Medicare was secondary insurance for Mr. Divis.  Explained that [employer] would only pay for 80% of eligible expenses for office surgery and that Medicare would only pay 20% of eligible expenses if [employer] was aware that Medicare was secondary insurance and [employer] sent Medicare the charges.  Otherwise, Mr. Dewalt would be responsible for 20% of eligible expenses for office surgery.  Also, strongly recommended that Mr. Hammen call Medicare and ask them if they would pick up the 20% of eligible charges with [employer] ([employer]) being the primary insurance. Informed Mrs. Agresta the estimate for what the 20% of eligible charges would be for each procedure.  Naveed Humphres, [name]

## 2011-05-14 ENCOUNTER — Telehealth: Payer: Self-pay | Admitting: *Deleted

## 2011-05-14 NOTE — Telephone Encounter (Signed)
Spoke at length with Mariella Saa Konrad Dolores Lenus"s wife) regarding insurance issues for varicose vein office surgery/procedures.   Explained that since [employer] was primary insurance and Medicare was secondary insurance that it was important for either she or Mr. Fontan to communicate with Medicare to see if Medicare would pay the 20% of eligible charges that [employer] would not pay for office surgery.  Gave Mrs. Lonigro CPT codes and DX codes to use in her communication with Medicare. After speaking with Morrie Sheldon at [facility] today who recommended submitting a pre-determination for varicose vein office surgery that might take up to 15 business days, I recommended cancelling Mr. Olesen office surgery for May 17, 2011 and rescheduling after decision was made by [facility] regarding pre-determination for varicose vein office surgery.  Mrs. Spackman agrees with this plan of action.  Lenoir Facchini, [name]

## 2011-05-17 ENCOUNTER — Other Ambulatory Visit: Payer: PRIVATE HEALTH INSURANCE | Admitting: Vascular Surgery

## 2011-05-21 ENCOUNTER — Other Ambulatory Visit: Payer: Self-pay | Admitting: *Deleted

## 2011-05-21 DIAGNOSIS — I83893 Varicose veins of bilateral lower extremities with other complications: Secondary | ICD-10-CM

## 2011-05-23 ENCOUNTER — Encounter: Payer: Self-pay | Admitting: Vascular Surgery

## 2011-05-24 ENCOUNTER — Ambulatory Visit (INDEPENDENT_AMBULATORY_CARE_PROVIDER_SITE_OTHER): Payer: PRIVATE HEALTH INSURANCE | Admitting: Vascular Surgery

## 2011-05-24 ENCOUNTER — Encounter: Payer: Self-pay | Admitting: Vascular Surgery

## 2011-05-24 ENCOUNTER — Ambulatory Visit: Payer: PRIVATE HEALTH INSURANCE | Admitting: Vascular Surgery

## 2011-05-24 VITALS — BP 118/74 | HR 76 | Resp 18 | Ht 74.0 in | Wt 230.0 lb

## 2011-05-24 DIAGNOSIS — I83893 Varicose veins of bilateral lower extremities with other complications: Secondary | ICD-10-CM

## 2011-05-24 HISTORY — PX: ENDOVENOUS ABLATION SAPHENOUS VEIN W/ LASER: SUR449

## 2011-05-24 NOTE — Progress Notes (Signed)
Laser Ablation Procedure      Date: 05/24/2011    Peter [name] DOB:November 10, 1950  Consent signed: Yes  Surgeon:T.F. Glenn Gullickson  Procedure: Laser Ablation: left Greater Saphenous Vein  BP 118/74  Pulse 76  Resp 18  Ht 6\' 2"  (1.88 m)  Wt 230 lb (104.327 kg)  BMI 29.53 kg/m2  Start time: 11:00AM   End time: 12:30PM  Tumescent Anesthesia: 550 cc 0.9% NaCl with 50 cc Lidocaine HCL with 1% Epi and 15 cc 8.4% NaHCO3  Local Anesthesia: 3 cc Lidocaine HCL and NaHCO3 (ratio 2:1)  Continuous Mode: 15 Watts Total Energy 2235 Joules Total Time2:29     Stab Phlebectomy  Sites: Thigh, Calf and Ankle  LEFT LEG >20  Patient tolerated procedure well: Yes  Notes: [name], [name]  Description of Procedure:  After marking the course of the saphenous vein and the secondary varicosities in the standing position, the patient was placed on the operating table in the supine position, and the left leg was prepped and draped in sterile fashion. Local anesthetic was administered, and under ultrasound guidance the saphenous vein was accessed with a micro needle and guide wire; then the micro puncture sheath was placed. A guide wire was inserted to the saphenofemoral junction, followed by a 5 french sheath.  The position of the sheath and then the laser fiber below the junction was confirmed using the ultrasound and visualization of the aiming beam.  Tumescent anesthesia was administered along the course of the saphenous vein using ultrasound guidance. Protective laser glasses were placed on the patient, and the laser was fired at at 15 watt continuous mode.  For a total of 2235 joules.  A steri strip was applied to the puncture site.  The patient was then put into Trendelenburg position.  Local anesthetic was utilized overlying the marked varicosities.  Greater than >20 stab wounds were made using the tip of an 11 blade; and using the vein hook,  The phlebectomies were performed using a hemostat to avulse  these varicosities.  Adequate hemostasis was achieved, and steri strips were applied to the stab wound.      ABD pads and thigh high compression stockings were applied.  Ace wrap bandages were applied over the phlebectomy sites and at the top of the saphenofemoral junction.  Blood loss was less than 15 cc.  The patient ambulated out of the operating room having tolerated the procedure well.

## 2011-05-26 ENCOUNTER — Encounter: Payer: Self-pay | Admitting: Vascular Surgery

## 2011-05-28 ENCOUNTER — Telehealth: Payer: Self-pay | Admitting: *Deleted

## 2011-05-28 NOTE — Telephone Encounter (Signed)
05/28/2011  Time: 10:47 AM   Patient Name: Peter [name]  Patient of: T.F. Early  Procedure:Laser Ablation left  Greater saphenous vein and stab phlebectomy >20 incisions 05-24-2011  Reached patient at home and checked  His status  Yes    Comments/Actions Taken: [name] states no problems with pain,swelling, or bleeding.  States his left leg "already feels much better." Reviewed  post procedural instructions with him and reminded him of duplex and follow up appointment with Dr. Arbie Cookey on 05-31-2011.  Ralynn San, [name]    @SIGNATURE @

## 2011-05-29 ENCOUNTER — Telehealth: Payer: Self-pay | Admitting: *Deleted

## 2011-05-29 NOTE — Telephone Encounter (Signed)
Left telephone message with Peter [name] asking him to come to [facility] at 845AM on 05-31-2011 to register.  Notified him that post LA duplex has been moved up to 9AM on 05-31-2011 and he will see Dr. Arbie Cookey  following duplex.  Kimm Ungaro, [name]

## 2011-05-30 ENCOUNTER — Encounter: Payer: Self-pay | Admitting: Vascular Surgery

## 2011-05-31 ENCOUNTER — Ambulatory Visit (INDEPENDENT_AMBULATORY_CARE_PROVIDER_SITE_OTHER): Payer: PRIVATE HEALTH INSURANCE | Admitting: Vascular Surgery

## 2011-05-31 ENCOUNTER — Encounter (INDEPENDENT_AMBULATORY_CARE_PROVIDER_SITE_OTHER): Payer: PRIVATE HEALTH INSURANCE | Admitting: *Deleted

## 2011-05-31 ENCOUNTER — Encounter: Payer: Self-pay | Admitting: Vascular Surgery

## 2011-05-31 VITALS — BP 131/79 | HR 68 | Resp 20 | Ht 74.0 in | Wt 235.0 lb

## 2011-05-31 DIAGNOSIS — I83893 Varicose veins of bilateral lower extremities with other complications: Secondary | ICD-10-CM

## 2011-05-31 DIAGNOSIS — Z48812 Encounter for surgical aftercare following surgery on the circulatory system: Secondary | ICD-10-CM

## 2011-05-31 DIAGNOSIS — I83812 Varicose veins of left lower extremities with pain: Secondary | ICD-10-CM

## 2011-05-31 DIAGNOSIS — I8310 Varicose veins of unspecified lower extremity with inflammation: Secondary | ICD-10-CM

## 2011-05-31 NOTE — Progress Notes (Signed)
The patient presents today for followup of left leg great saphenous vein ablation and stab phlebectomy of greater than 20 very large tributary varicosities throughout his thigh calf and ankle. He has done extremely well has been compliant with his compression. He reports a dramatic difference in the feeling of his leg with a much lighter cessation after prolonged standing.  Physical exam : Typical bruising with good healing of all small stab sites.  Venous duplex. Ablation of his great saphenous vein from the distal and site insertion site at the knee up to the saphenofemoral junction. No evidence of DVT.  Impression and plan: Successful ablation of right great saphenous vein and stab phlebectomy of large tributary varicosities. He is scheduled for small saphenous vein ablation on the left for correction of his hypertension in one week

## 2011-06-06 ENCOUNTER — Encounter: Payer: Self-pay | Admitting: Vascular Surgery

## 2011-06-07 ENCOUNTER — Ambulatory Visit (INDEPENDENT_AMBULATORY_CARE_PROVIDER_SITE_OTHER): Payer: PRIVATE HEALTH INSURANCE | Admitting: Vascular Surgery

## 2011-06-07 ENCOUNTER — Encounter: Payer: Self-pay | Admitting: Vascular Surgery

## 2011-06-07 VITALS — BP 125/75 | HR 67 | Resp 18 | Ht 74.0 in | Wt 235.0 lb

## 2011-06-07 DIAGNOSIS — I83893 Varicose veins of bilateral lower extremities with other complications: Secondary | ICD-10-CM

## 2011-06-07 HISTORY — PX: ENDOVENOUS ABLATION SAPHENOUS VEIN W/ LASER: SUR449

## 2011-06-07 NOTE — Progress Notes (Signed)
Laser Ablation Procedure      Date: 06/07/2011    Peter [name] DOB:Sep 27, 1950  Consent signed: Yes  Surgeon:T.F. Cyncere Ruhe  Procedure: Laser Ablation: left Small Saphenous Vein  BP 125/75  Pulse 67  Resp 18  Ht 6\' 2"  (1.88 m)  Wt 235 lb (106.595 kg)  BMI 30.17 kg/m2  Start time:  8:40AM   End time: 9:25AM  Tumescent Anesthesia: 225 cc 0.9% NaCl with 50 cc Lidocaine HCL with 1% Epi and 15 cc 8.4% NaHCO3  Local Anesthesia: 1 cc Lidocaine HCL and NaHCO3 (ratio 2:1)  Continuous Mode: 15 Watts Total Energy 746 Joules Total Time0:49       Patient tolerated procedure well: Yes    Description of Procedure:  After marking the course of the saphenous vein and the secondary varicosities in the standing position, the patient was placed on the operating table in the prone position, and the left leg was prepped and draped in sterile fashion. Local anesthetic was administered, and under ultrasound guidance the saphenous vein was accessed with a micro needle and guide wire; then the micro puncture sheath was placed. A guide wire was inserted to the saphenopopliteal junction, followed by a 5 french sheath.  The position of the sheath and then the laser fiber below the junction was confirmed using the ultrasound and visualization of the aiming beam.  Tumescent anesthesia was administered along the course of the saphenous vein using ultrasound guidance. Protective laser glasses were placed on the patient, and the laser was fired at at 15 watt continuous mode.  For a total of 746 joules.  A steri strip was applied to the puncture site.      ABD pads and thigh high compression stockings were applied.  Ace wrap bandages were applied  at the top of the saphenopopliteal junction.  Blood loss was less than 15 cc.  The patient ambulated out of the operating room having tolerated the procedure well.

## 2011-06-08 ENCOUNTER — Encounter: Payer: Self-pay | Admitting: Vascular Surgery

## 2011-06-08 NOTE — Procedures (Unsigned)
DUPLEX DEEP VENOUS EXAM - LOWER EXTREMITY  INDICATION:  Followup of left great saphenous vein ablation.  HISTORY:  Edema:  No Trauma/Surgery:  Left GSV ablation 1 week ago Pain:  No PE:  No Previous DVT:  No Anticoagulants:  No Other:  DUPLEX EXAM:               CFV   SFV   PopV  PTV     GSV               R  L  R  L  R  L  R   L   R  L Thrombosis    0  +     0     0      NV     + Spontaneous   +  +     +     +             0 Phasic        +  +     +     +             0 Augmentation  +  +     +     +             0 Compressible  +  +     +     +             0 Competent     +  +     +     +             +  Legend:  + - yes  o - no  p - partial  D - decreased  IMPRESSION:  No evidence of left lower extremity deep venous thrombosis. Successful left great saphenous vein ablation with no flow visualized from the saphenofemoral junction to the distal insertion site.   _____________________________ Larina Earthly, M.D.  EM/MEDQ  D:  05/31/2011  T:  05/31/2011  Job:  [id number]

## 2011-06-12 ENCOUNTER — Ambulatory Visit: Payer: PRIVATE HEALTH INSURANCE | Admitting: Vascular Surgery

## 2011-06-12 ENCOUNTER — Telehealth: Payer: Self-pay | Admitting: *Deleted

## 2011-06-12 NOTE — Telephone Encounter (Signed)
06/12/2011  Time: 10:13 AM   Patient Name: Peter [name]  Patient of: T.F. Early  Procedure:Laser Ablation left  Small saphenous vein 06-07-2011  Reached patient at home and checked  His status  Yes    Comments/Actions Taken: [name] states no problems with pain or swelling.  Reviewed post procedural care instructions with [name] and reminded him of post laser ablation duplex and follow up with Dr. Arbie Cookey on 06-14-2011.  [name], [name]    @SIGNATURE @

## 2011-06-13 ENCOUNTER — Encounter: Payer: Self-pay | Admitting: Vascular Surgery

## 2011-06-14 ENCOUNTER — Encounter: Payer: Self-pay | Admitting: Vascular Surgery

## 2011-06-14 ENCOUNTER — Ambulatory Visit (INDEPENDENT_AMBULATORY_CARE_PROVIDER_SITE_OTHER): Payer: PRIVATE HEALTH INSURANCE | Admitting: Vascular Surgery

## 2011-06-14 ENCOUNTER — Encounter (INDEPENDENT_AMBULATORY_CARE_PROVIDER_SITE_OTHER): Payer: PRIVATE HEALTH INSURANCE | Admitting: *Deleted

## 2011-06-14 VITALS — BP 122/77 | HR 58 | Resp 18 | Ht 74.0 in | Wt 235.0 lb

## 2011-06-14 DIAGNOSIS — I83893 Varicose veins of bilateral lower extremities with other complications: Secondary | ICD-10-CM

## 2011-06-14 DIAGNOSIS — I8310 Varicose veins of unspecified lower extremity with inflammation: Secondary | ICD-10-CM

## 2011-06-14 DIAGNOSIS — Z48812 Encounter for surgical aftercare following surgery on the circulatory system: Secondary | ICD-10-CM

## 2011-06-14 NOTE — Progress Notes (Signed)
The patient has today for followup of his left small saphenous vein ablation one week ago. This is following ablation of his left great saphenous vein and stab phlebectomy of multiple tributary several weeks prior. He is doing quite well with the procedure. He has minimal discomfort. He has continued resolution of the bruising that he had around the stab phlebectomy sites.  Vascular lab today reveals no evidence of popliteal DVT. He has successful ablation of his left small saphenous vein.  Impression and plan successful left great and small saphenous vein ablation and stab phlebectomy. The patient will see [name] again on an as-needed basis

## 2011-06-18 ENCOUNTER — Telehealth: Payer: Self-pay | Admitting: *Deleted

## 2011-06-18 NOTE — Telephone Encounter (Signed)
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 He is 11 days post laser ablation by TFE on his SSV. His one week fu reflux study was normal. I told him to wear stocking when up on his feet. Take frequent breaks during the drive and to walk around at rest areas. Take Ibuprofen and use heat prn. Told him not to lift over 3o lbs. And to take it easy. I left this message on his answering machine.

## 2011-06-22 NOTE — Procedures (Unsigned)
DUPLEX DEEP VENOUS EXAM - LOWER EXTREMITY  INDICATION:  Followup left small saphenous vein ablation.  HISTORY:  Edema:  No Trauma/Surgery:  Left SFV ablation. Pain:  No PE:  No Previous DVT:  No Anticoagulants:  No Other:  DUPLEX EXAM:               CFV   SFV   PopV  PTV    SSV               R  L  R  L  R  L  R   L  R  L Thrombosis       o     o     o      o     + Spontaneous      +     +     +      +     o Phasic           +     +     +      +     o Augmentation     +     +     +      +     o Compressible     +     +     +      +     o Competent        +     +     +      +     +  Legend:  + - yes  o - no  p - partial  D - decreased  IMPRESSION: 1. No evidence of left lower extremity deep venous thrombosis. 2. Successful ablation of the left small saphenous vein with an     outflow visualized in the proximal, mid or distal segments.   _____________________________ Larina Earthly, M.D.  EM/MEDQ  D:  06/15/2011  T:  06/15/2011  Job:  [id number]

## 2016-10-24 ENCOUNTER — Other Ambulatory Visit: Payer: Self-pay | Admitting: Orthopedic Surgery

## 2016-10-24 ENCOUNTER — Other Ambulatory Visit: Payer: Self-pay | Admitting: Specialist

## 2016-10-24 DIAGNOSIS — S92102K Unspecified fracture of left talus, subsequent encounter for fracture with nonunion: Secondary | ICD-10-CM

## 2016-10-24 DIAGNOSIS — M5126 Other intervertebral disc displacement, lumbar region: Secondary | ICD-10-CM

## 2016-11-12 ENCOUNTER — Other Ambulatory Visit: Payer: Self-pay | Admitting: Specialist

## 2016-11-12 DIAGNOSIS — S92102K Unspecified fracture of left talus, subsequent encounter for fracture with nonunion: Secondary | ICD-10-CM

## 2016-11-13 ENCOUNTER — Ambulatory Visit
Admission: RE | Admit: 2016-11-13 | Discharge: 2016-11-13 | Disposition: A | Discharge status: Home / Self Care | Payer: Medicare Other | Source: Ambulatory Visit | Attending: Orthopedic Surgery | Admitting: Orthopedic Surgery

## 2016-11-13 ENCOUNTER — Ambulatory Visit
Admission: RE | Admit: 2016-11-13 | Discharge: 2016-11-13 | Disposition: A | Discharge status: Home / Self Care | Payer: Medicare Other | Source: Ambulatory Visit | Attending: Specialist | Admitting: Specialist

## 2016-11-13 DIAGNOSIS — M5126 Other intervertebral disc displacement, lumbar region: Secondary | ICD-10-CM

## 2016-11-13 DIAGNOSIS — S92102K Unspecified fracture of left talus, subsequent encounter for fracture with nonunion: Secondary | ICD-10-CM

## 2016-12-21 ENCOUNTER — Ambulatory Visit: Payer: Self-pay | Admitting: Orthopedic Surgery

## 2016-12-28 ENCOUNTER — Ambulatory Visit: Payer: Self-pay | Admitting: Orthopedic Surgery

## 2016-12-28 NOTE — H&P (Signed)
Peter [name] is an 66 y.o. male.   Chief Complaint: back and leg pain HPI: The patient is a 66 year old male who presents today for follow up of their back. The patient is being followed for their low back symptoms. They are now year(s) out from when symptoms began. Symptoms reported today include: pain. Current treatment includes: activity modification and pain medications. The following medication has been used for pain control: Oxycodone. The patient presents today following ESI L4-5 x 2 weeks. The patient reports the injection helped for one day.  Note:Patient reports epidural may be only one day of relief. That radiates down into the top of his foot particularly on the right occasionally on the left. I 8 out of 10 is his pain. Minimal back pain.  Past Medical History:  Diagnosis Date  . AAA (abdominal aortic aneurysm) ([place])   . Anxiety   . Foot pain   . GERD (gastroesophageal reflux disease)   . Macular degeneration   . Palpitations   . Varicose veins     Past Surgical History:  Procedure Laterality Date  . ABDOMINAL AORTIC ANEURYSM REPAIR  2008  . ENDOVENOUS ABLATION SAPHENOUS VEIN W/ LASER  05-24-2011   left greater saphenous vein and stab phlebectomy left leg >20 incisions  . ENDOVENOUS ABLATION SAPHENOUS VEIN W/ LASER  06-07-2011   left small saphenous Curt Jews MD  . ROTATOR CUFF REPAIR      Family History  Problem Relation Age of Onset  . Diabetes Mother   . Heart disease Mother   . Diabetes Father   . Heart disease Father   . Diabetes Maternal Grandmother   . Diabetes Maternal Grandfather   . Diabetes Paternal Grandmother   . Diabetes Paternal Grandfather    Social History:  reports that  has never smoked. He has quit using smokeless tobacco. His smokeless tobacco use included chew. He reports that he does not drink alcohol or use drugs.  Allergies:  Allergies  Allergen Reactions  . Penicillins      (Not in a hospital admission)  No results found  for this or any previous visit (from the past 48 hour(s)). No results found.  Review of Systems  Constitutional: Negative.   HENT: Negative.   Eyes: Negative.   Respiratory: Negative.   Cardiovascular: Negative.   Gastrointestinal: Negative.   Genitourinary: Negative.   Musculoskeletal: Positive for back pain and joint pain.  Skin: Negative.   Neurological: Positive for sensory change and focal weakness.    There were no vitals taken for this visit. Physical Exam  Constitutional: He is oriented to person, place, and time. He appears well-developed.  HENT:  Head: Normocephalic.  Eyes: Pupils are equal, round, and reactive to light.  Neck: Normal range of motion.  Cardiovascular: Normal rate.  Respiratory: Effort normal.  GI: Soft.  Musculoskeletal:  Moderate distress was an antalgic gait. Straight leg raise on right his buttock and calf pain Nadelman left. EHLs 405 on the right 5-/5 on the left. No DVT. Noticeably hips knees and ankles. Some discomfort and extension of the lumbar spine. 1+ DTRs in knees and Achilles. Sensory exam is intact. No Babinski or clonus. No instability hep C's and ankles. Pelvis stable abdomen soft nontender thoracic.  X-rays demonstrated minimal listhesis at L5 on S1. Freckle lumbar scoliosis is noted as well mild at L4-5. 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   Neurological: He is alert and oriented to person, place, and time.  Skin: Skin is warm and dry.     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 He is underlying scoliosis and degenerative listhesis at 5 1. Is minimal back pain. We discussed options living with his symptoms do not feel another injection would be of benefit to him. Most of discussed surgical options including a decompression at L4-5 and lateral recess decompression 4 5 including the risk and benefits of bleeding infection damage to  neurovascular structures worsening symptoms bettering of symptoms. Need for fusion the future especially with a scoliosis he understands that. He has history of a MRSA infection shoulder we should use vancomycin. Preoperative decolonization. No recent chest pain shortness of breath we will kindly request preoperative clearance we discussed the surgery and the postoperative course in detail.  I had an extensive discussion of the risks and benefits of the lumbar decompression with the patient including bleeding, infection, damage to neurovascular structures, epidural fibrosis, CSF leak requiring repair. We also discussed increase in pain, adjacent segment disease, recurrent disc herniation, need for future surgery including repeat decompression and/or fusion. We also discussed risks of postoperative hematoma, paralysis, anesthetic complications including DVT, PE, death, cardiopulmonary dysfunction. In addition, the perioperative and postoperative courses were discussed in detail including the rehabilitative time and return to functional activity and work. I provided the patient with an illustrated handout and utilized the appropriate surgical models.  Plan microlumbar decompression L4-5  Cecilie Kicks., PA-C for Dr Tonita Cong 12/28/2016, 11:36 AM

## 2016-12-28 NOTE — H&P (View-Only) (Signed)
Peter [name] is an 66 y.o. male.   Chief Complaint: back and leg pain HPI: The patient is a 66 year old male who presents today for follow up of their back. The patient is being followed for their low back symptoms. They are now year(s) out from when symptoms began. Symptoms reported today include: pain. Current treatment includes: activity modification and pain medications. The following medication has been used for pain control: Oxycodone. The patient presents today following ESI L4-5 x 2 weeks. The patient reports the injection helped for one day.  Note:Patient reports epidural may be only one day of relief. That radiates down into the top of his foot particularly on the right occasionally on the left. I 8 out of 10 is his pain. Minimal back pain.  Past Medical History:  Diagnosis Date  . AAA (abdominal aortic aneurysm) ([place])   . Anxiety   . Foot pain   . GERD (gastroesophageal reflux disease)   . Macular degeneration   . Palpitations   . Varicose veins     Past Surgical History:  Procedure Laterality Date  . ABDOMINAL AORTIC ANEURYSM REPAIR  2008  . ENDOVENOUS ABLATION SAPHENOUS VEIN W/ LASER  05-24-2011   left greater saphenous vein and stab phlebectomy left leg >20 incisions  . ENDOVENOUS ABLATION SAPHENOUS VEIN W/ LASER  06-07-2011   left small saphenous Curt Jews MD  . ROTATOR CUFF REPAIR      Family History  Problem Relation Age of Onset  . Diabetes Mother   . Heart disease Mother   . Diabetes Father   . Heart disease Father   . Diabetes Maternal Grandmother   . Diabetes Maternal Grandfather   . Diabetes Paternal Grandmother   . Diabetes Paternal Grandfather    Social History:  reports that  has never smoked. He has quit using smokeless tobacco. His smokeless tobacco use included chew. He reports that he does not drink alcohol or use drugs.  Allergies:  Allergies  Allergen Reactions  . Penicillins      (Not in a hospital admission)  No results found  for this or any previous visit (from the past 48 hour(s)). No results found.  Review of Systems  Constitutional: Negative.   HENT: Negative.   Eyes: Negative.   Respiratory: Negative.   Cardiovascular: Negative.   Gastrointestinal: Negative.   Genitourinary: Negative.   Musculoskeletal: Positive for back pain and joint pain.  Skin: Negative.   Neurological: Positive for sensory change and focal weakness.    There were no vitals taken for this visit. Physical Exam  Constitutional: He is oriented to person, place, and time. He appears well-developed.  HENT:  Head: Normocephalic.  Eyes: Pupils are equal, round, and reactive to light.  Neck: Normal range of motion.  Cardiovascular: Normal rate.  Respiratory: Effort normal.  GI: Soft.  Musculoskeletal:  Moderate distress was an antalgic gait. Straight leg raise on right his buttock and calf pain Nadelman left. EHLs 405 on the right 5-/5 on the left. No DVT. Noticeably hips knees and ankles. Some discomfort and extension of the lumbar spine. 1+ DTRs in knees and Achilles. Sensory exam is intact. No Babinski or clonus. No instability hep C's and ankles. Pelvis stable abdomen soft nontender thoracic.  X-rays demonstrated minimal listhesis at L5 on S1. Freckle lumbar scoliosis is noted as well mild at L4-5. 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   Neurological: He is alert and oriented to person, place, and time.  Skin: Skin is warm and dry.     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 He is underlying scoliosis and degenerative listhesis at 5 1. Is minimal back pain. We discussed options living with his symptoms do not feel another injection would be of benefit to him. Most of discussed surgical options including a decompression at L4-5 and lateral recess decompression 4 5 including the risk and benefits of bleeding infection damage to  neurovascular structures worsening symptoms bettering of symptoms. Need for fusion the future especially with a scoliosis he understands that. He has history of a MRSA infection shoulder we should use vancomycin. Preoperative decolonization. No recent chest pain shortness of breath we will kindly request preoperative clearance we discussed the surgery and the postoperative course in detail.  I had an extensive discussion of the risks and benefits of the lumbar decompression with the patient including bleeding, infection, damage to neurovascular structures, epidural fibrosis, CSF leak requiring repair. We also discussed increase in pain, adjacent segment disease, recurrent disc herniation, need for future surgery including repeat decompression and/or fusion. We also discussed risks of postoperative hematoma, paralysis, anesthetic complications including DVT, PE, death, cardiopulmonary dysfunction. In addition, the perioperative and postoperative courses were discussed in detail including the rehabilitative time and return to functional activity and work. I provided the patient with an illustrated handout and utilized the appropriate surgical models.  Plan microlumbar decompression L4-5  Cecilie Kicks., PA-C for Dr Tonita Cong 12/28/2016, 11:36 AM

## 2017-01-03 ENCOUNTER — Other Ambulatory Visit: Payer: Self-pay

## 2017-01-03 ENCOUNTER — Encounter (INDEPENDENT_AMBULATORY_CARE_PROVIDER_SITE_OTHER): Payer: Self-pay

## 2017-01-03 ENCOUNTER — Encounter (HOSPITAL_COMMUNITY)
Admission: RE | Admit: 2017-01-03 | Discharge: 2017-01-03 | Disposition: A | Discharge status: Home / Self Care | Payer: BLUE CROSS/BLUE SHIELD | Source: Ambulatory Visit | Attending: Specialist | Admitting: Specialist

## 2017-01-03 ENCOUNTER — Ambulatory Visit (HOSPITAL_COMMUNITY)
Admission: RE | Admit: 2017-01-03 | Discharge: 2017-01-03 | Disposition: A | Discharge status: Home / Self Care | Payer: BLUE CROSS/BLUE SHIELD | Source: Ambulatory Visit | Attending: Orthopedic Surgery | Admitting: Orthopedic Surgery

## 2017-01-03 ENCOUNTER — Encounter (HOSPITAL_COMMUNITY): Payer: Self-pay

## 2017-01-03 DIAGNOSIS — M4800 Spinal stenosis, site unspecified: Secondary | ICD-10-CM | POA: Insufficient documentation

## 2017-01-03 DIAGNOSIS — M5136 Other intervertebral disc degeneration, lumbar region: Secondary | ICD-10-CM | POA: Insufficient documentation

## 2017-01-03 DIAGNOSIS — M5126 Other intervertebral disc displacement, lumbar region: Secondary | ICD-10-CM | POA: Insufficient documentation

## 2017-01-03 DIAGNOSIS — Z01812 Encounter for preprocedural laboratory examination: Secondary | ICD-10-CM | POA: Diagnosis not present

## 2017-01-03 DIAGNOSIS — Z01818 Encounter for other preprocedural examination: Secondary | ICD-10-CM | POA: Diagnosis present

## 2017-01-03 HISTORY — DX: Personal history of urinary calculi: Z87.442

## 2017-01-03 HISTORY — DX: Dystrophies primarily involving the retinal pigment epithelium: H35.54

## 2017-01-03 HISTORY — DX: Unspecified osteoarthritis, unspecified site: M19.90

## 2017-01-03 HISTORY — DX: Type 2 diabetes mellitus without complications: E11.9

## 2017-01-03 LAB — CBC
HCT: 55.7 % — ABNORMAL HIGH (ref 39.0–52.0)
Hemoglobin: 18.8 g/dL — ABNORMAL HIGH (ref 13.0–17.0)
MCH: 28.9 pg (ref 26.0–34.0)
MCHC: 33.8 g/dL (ref 30.0–36.0)
MCV: 85.6 fL (ref 78.0–100.0)
Platelets: 197 10*3/uL (ref 150–400)
RBC: 6.51 MIL/uL — ABNORMAL HIGH (ref 4.22–5.81)
RDW: 15.1 % (ref 11.5–15.5)
WBC: 8.3 10*3/uL (ref 4.0–10.5)

## 2017-01-03 LAB — BASIC METABOLIC PANEL
Anion gap: 10 (ref 5–15)
BUN: 24 mg/dL — ABNORMAL HIGH (ref 6–20)
CO2: 24 mmol/L (ref 22–32)
Calcium: 9.7 mg/dL (ref 8.9–10.3)
Chloride: 101 mmol/L (ref 101–111)
Creatinine, Ser: 1.21 mg/dL (ref 0.61–1.24)
GFR calc Af Amer: >60 mL/min (ref >60)
GFR calc non Af Amer: >60 mL/min (ref >60)
Glucose, Bld: 126 mg/dL — ABNORMAL HIGH (ref 65–99)
Potassium: 5 mmol/L (ref 3.5–5.1)
Sodium: 135 mmol/L (ref 135–145)

## 2017-01-03 LAB — SURGICAL PCR SCREEN
MRSA, PCR: NEGATIVE
Staphylococcus aureus: NEGATIVE

## 2017-01-03 LAB — HEMOGLOBIN A1C
Hgb A1c MFr Bld: 7.3 % — ABNORMAL HIGH (ref 4.8–5.6)
Mean Plasma Glucose: 162.81 mg/dL

## 2017-01-03 NOTE — Progress Notes (Signed)
Clearance 12-17-16 chart and lov  Dr. Tye Savoy.  Labs 10-17-16 chart

## 2017-01-03 NOTE — Patient Instructions (Signed)
Peter [name]  01/03/2017   Your procedure is scheduled on: 01-09-17  Report to [facility] Main  Entrance             Take [place]  elevators to 3rd floor to  [place] at      [phone].    Call this number if you have problems the morning of surgery [phone]    Remember: ONLY 1 PERSON MAY GO WITH YOU TO SHORT STAY TO GET  READY MORNING OF YOUR SURGERY.  Do not eat food or drink liquids :After Midnight.     Take these medicines the morning of surgery with A SIP OF WATER: NONE DO NOT TAKE ANY DIABETIC MEDICATIONS DAY OF YOUR SURGERY                               You may not have any metal on your body including hair pins and              piercings  Do not wear jewelry,lotions, powders or perfumes, deodorant           .              Men may shave face and neck.   Do not bring valuables to the hospital. [place].  Contacts, dentures or bridgework may not be worn into surgery.  Leave suitcase in the car. After surgery it may be brought to your room.               Please read over the following fact sheets you were given: _____________________________________________________________________           [facility] [address] - Preparing for Surgery Before surgery, you can play an important role.  Because skin is not sterile, your skin needs to be as free of germs as possible.  You can reduce the number of germs on your skin by washing with CHG (chlorahexidine gluconate) soap before surgery.  CHG is an antiseptic cleaner which kills germs and bonds with the skin to continue killing germs even after washing. Please DO NOT use if you have an allergy to CHG or antibacterial soaps.  If your skin becomes reddened/irritated stop using the CHG and inform your nurse when you arrive at Short Stay. Do not shave (including legs and underarms) for at least 48 hours prior to the first CHG shower.  You may shave your  face/neck. Please follow these instructions carefully:  1.  Shower with CHG Soap the night before surgery and the  morning of Surgery.  2.  If you choose to wash your hair, wash your hair first as usual with your  normal  shampoo.  3.  After you shampoo, rinse your hair and body thoroughly to remove the  shampoo.                           4.  Use CHG as you would any other liquid soap.  You can apply chg directly  to the skin and wash                       Gently with a scrungie or clean washcloth.  5.  Apply the CHG Soap to your body ONLY FROM THE NECK DOWN.   Do not use on face/ open                           Wound or open sores. Avoid contact with eyes, ears mouth and genitals (private parts).                       Wash face,  Genitals (private parts) with your normal soap.             6.  Wash thoroughly, paying special attention to the area where your surgery  will be performed.  7.  Thoroughly rinse your body with warm water from the neck down.  8.  DO NOT shower/wash with your normal soap after using and rinsing off  the CHG Soap.                9.  Pat yourself dry with a clean towel.            10.  Wear clean pajamas.            11.  Place clean sheets on your bed the night of your first shower and do not  sleep with pets. Day of Surgery : Do not apply any lotions/deodorants the morning of surgery.  Please wear clean clothes to the hospital/surgery center.  FAILURE TO FOLLOW THESE INSTRUCTIONS MAY RESULT IN THE CANCELLATION OF YOUR SURGERY PATIENT SIGNATURE_________________________________  NURSE SIGNATURE__________________________________  ________________________________________________________________________   Adam Phenix  An incentive spirometer is a tool that can help keep your lungs clear and active. This tool measures how well you are filling your lungs with each breath. Taking long deep breaths may help reverse or decrease the chance of developing breathing  (pulmonary) problems (especially infection) following:  A long period of time when you are unable to move or be active. BEFORE THE PROCEDURE   If the spirometer includes an indicator to show your best effort, your nurse or respiratory therapist will set it to a desired goal.  If possible, sit up straight or lean slightly forward. Try not to slouch.  Hold the incentive spirometer in an upright position. INSTRUCTIONS FOR USE  1. Sit on the edge of your bed if possible, or sit up as far as you can in bed or on a chair. 2. Hold the incentive spirometer in an upright position. 3. Breathe out normally. 4. Place the mouthpiece in your mouth and seal your lips tightly around it. 5. Breathe in slowly and as deeply as possible, raising the piston or the ball toward the top of the column. 6. Hold your breath for 3-5 seconds or for as long as possible. Allow the piston or ball to fall to the bottom of the column. 7. Remove the mouthpiece from your mouth and breathe out normally. 8. Rest for a few seconds and repeat Steps 1 through 7 at least 10 times every 1-2 hours when you are awake. Take your time and take a few normal breaths between deep breaths. 9. The spirometer may include an indicator to show your best effort. Use the indicator as a goal to work toward during each repetition. 10. After each set of 10 deep breaths, practice coughing to be sure your lungs are clear. If you have an incision (the cut made at the time of surgery), support your incision when coughing by placing a pillow or  rolled up towels firmly against it. Once you are able to get out of bed, walk around indoors and cough well. You may stop using the incentive spirometer when instructed by your caregiver.  RISKS AND COMPLICATIONS  Take your time so you do not get dizzy or light-headed.  If you are in pain, you may need to take or ask for pain medication before doing incentive spirometry. It is harder to take a deep breath if you  are having pain. AFTER USE  Rest and breathe slowly and easily.  It can be helpful to keep track of a log of your progress. Your caregiver can provide you with a simple table to help with this. If you are using the spirometer at home, follow these instructions: [place] IF:   You are having difficultly using the spirometer.  You have trouble using the spirometer as often as instructed.  Your pain medication is not giving enough relief while using the spirometer.  You develop fever of 100.5 F (38.1 C) or higher. SEEK IMMEDIATE MEDICAL CARE IF:   You cough up bloody sputum that had not been present before.  You develop fever of 102 F (38.9 C) or greater.  You develop worsening pain at or near the incision site. MAKE SURE YOU:   Understand these instructions.  Will watch your condition.  Will get help right away if you are not doing well or get worse. Document Released: 05/14/2006 Document Revised: 03/26/2011 Document Reviewed: 07/15/2006 [facility] Patient Information 2014 [place], [place].   ________________________________________________________________________

## 2017-01-09 ENCOUNTER — Ambulatory Visit (HOSPITAL_COMMUNITY): Payer: BLUE CROSS/BLUE SHIELD

## 2017-01-09 ENCOUNTER — Ambulatory Visit (HOSPITAL_COMMUNITY)
Admission: RE | Admit: 2017-01-09 | Discharge: 2017-01-10 | Disposition: A | Discharge status: Home / Self Care | Payer: BLUE CROSS/BLUE SHIELD | Source: Ambulatory Visit | Attending: Specialist | Admitting: Specialist

## 2017-01-09 ENCOUNTER — Other Ambulatory Visit: Payer: Self-pay

## 2017-01-09 ENCOUNTER — Encounter (HOSPITAL_COMMUNITY): Payer: Self-pay | Admitting: *Deleted

## 2017-01-09 ENCOUNTER — Encounter (HOSPITAL_COMMUNITY)
Admission: RE | Disposition: A | Discharge status: Home / Self Care | Payer: Self-pay | Source: Ambulatory Visit | Attending: Specialist

## 2017-01-09 ENCOUNTER — Ambulatory Visit (HOSPITAL_COMMUNITY): Payer: BLUE CROSS/BLUE SHIELD | Admitting: Anesthesiology

## 2017-01-09 DIAGNOSIS — Z7984 Long term (current) use of oral hypoglycemic drugs: Secondary | ICD-10-CM | POA: Insufficient documentation

## 2017-01-09 DIAGNOSIS — E1151 Type 2 diabetes mellitus with diabetic peripheral angiopathy without gangrene: Secondary | ICD-10-CM | POA: Insufficient documentation

## 2017-01-09 DIAGNOSIS — Z87891 Personal history of nicotine dependence: Secondary | ICD-10-CM | POA: Diagnosis not present

## 2017-01-09 DIAGNOSIS — M48061 Spinal stenosis, lumbar region without neurogenic claudication: Secondary | ICD-10-CM | POA: Diagnosis present

## 2017-01-09 DIAGNOSIS — I10 Essential (primary) hypertension: Secondary | ICD-10-CM | POA: Insufficient documentation

## 2017-01-09 DIAGNOSIS — M1990 Unspecified osteoarthritis, unspecified site: Secondary | ICD-10-CM | POA: Diagnosis not present

## 2017-01-09 DIAGNOSIS — K219 Gastro-esophageal reflux disease without esophagitis: Secondary | ICD-10-CM | POA: Diagnosis not present

## 2017-01-09 DIAGNOSIS — F419 Anxiety disorder, unspecified: Secondary | ICD-10-CM | POA: Diagnosis not present

## 2017-01-09 DIAGNOSIS — Z419 Encounter for procedure for purposes other than remedying health state, unspecified: Secondary | ICD-10-CM

## 2017-01-09 DIAGNOSIS — M5136 Other intervertebral disc degeneration, lumbar region: Secondary | ICD-10-CM | POA: Diagnosis not present

## 2017-01-09 DIAGNOSIS — Z79899 Other long term (current) drug therapy: Secondary | ICD-10-CM | POA: Insufficient documentation

## 2017-01-09 DIAGNOSIS — Z880 Allergy status to penicillin: Secondary | ICD-10-CM | POA: Insufficient documentation

## 2017-01-09 HISTORY — PX: LUMBAR LAMINECTOMY/DECOMPRESSION MICRODISCECTOMY: SHX5026

## 2017-01-09 LAB — GLUCOSE, CAPILLARY
Glucose-Capillary: 149 mg/dL — ABNORMAL HIGH (ref 65–99)
Glucose-Capillary: 129 mg/dL — ABNORMAL HIGH (ref 65–99)
Glucose-Capillary: 190 mg/dL — ABNORMAL HIGH (ref 65–99)
Glucose-Capillary: 180 mg/dL — ABNORMAL HIGH (ref 65–99)
Glucose-Capillary: 220 mg/dL — ABNORMAL HIGH (ref 65–99)
Glucose-Capillary: 189 mg/dL — ABNORMAL HIGH (ref 65–99)

## 2017-01-09 SURGERY — LUMBAR LAMINECTOMY/DECOMPRESSION MICRODISCECTOMY 1 LEVEL
Anesthesia: General | Site: Back

## 2017-01-09 MED ORDER — PROPOFOL 10 MG/ML IV BOLUS
INTRAVENOUS | Status: DC | PRN
Start: 2017-01-09 — End: 2017-01-09
  Administered 2017-01-09: 150 mg via INTRAVENOUS

## 2017-01-09 MED ORDER — DEXTROSE 5 % IV SOLN
500.0000 mg | Freq: Four times a day (QID) | INTRAVENOUS | Status: DC | PRN
  Filled 2017-01-09: qty 5

## 2017-01-09 MED ORDER — ALUM & MAG HYDROXIDE-SIMETH 200-200-20 MG/5ML PO SUSP: 30.0000 mL | Freq: Four times a day (QID) | ORAL | Status: DC | PRN

## 2017-01-09 MED ORDER — INSULIN ASPART 100 UNIT/ML ~~LOC~~ SOLN
0.0000 [IU] | Freq: Three times a day (TID) | SUBCUTANEOUS | Status: DC
  Administered 2017-01-09: 3 [IU] via SUBCUTANEOUS
  Administered 2017-01-10 (×2): 2 [IU] via SUBCUTANEOUS

## 2017-01-09 MED ORDER — DOCUSATE SODIUM 100 MG PO CAPS: 100.0000 mg | ORAL_CAPSULE | Freq: Two times a day (BID) | ORAL | 2 refills | Status: AC

## 2017-01-09 MED ORDER — ACETAMINOPHEN 325 MG PO TABS: 650.0000 mg | ORAL_TABLET | ORAL | Status: DC | PRN

## 2017-01-09 MED ORDER — MAGNESIUM CITRATE PO SOLN: 1.0000 | Freq: Once | ORAL | Status: DC | PRN

## 2017-01-09 MED ORDER — LIDOCAINE 2% (20 MG/ML) 5 ML SYRINGE
INTRAMUSCULAR | Status: DC | PRN
  Administered 2017-01-09: 100 mg via INTRAVENOUS

## 2017-01-09 MED ORDER — DOCUSATE SODIUM 100 MG PO CAPS
100.0000 mg | ORAL_CAPSULE | Freq: Two times a day (BID) | ORAL | Status: DC
  Administered 2017-01-09 – 2017-01-10 (×2): 100 mg via ORAL
  Filled 2017-01-09 (×3): qty 1

## 2017-01-09 MED ORDER — PHENYLEPHRINE 40 MCG/ML (10ML) SYRINGE FOR IV PUSH (FOR BLOOD PRESSURE SUPPORT)
PREFILLED_SYRINGE | INTRAVENOUS | Status: AC
  Filled 2017-01-09: qty 10

## 2017-01-09 MED ORDER — BUPIVACAINE-EPINEPHRINE (PF) 0.5% -1:200000 IJ SOLN
INTRAMUSCULAR | Status: DC | PRN
  Administered 2017-01-09: 13 mL via PERINEURAL

## 2017-01-09 MED ORDER — BISACODYL 5 MG PO TBEC: 5.0000 mg | DELAYED_RELEASE_TABLET | Freq: Every day | ORAL | Status: DC | PRN

## 2017-01-09 MED ORDER — ACETAMINOPHEN 325 MG PO TABS: 325.0000 mg | ORAL_TABLET | ORAL | Status: DC | PRN

## 2017-01-09 MED ORDER — LACTATED RINGERS IV SOLN
INTRAVENOUS | Status: DC
  Administered 2017-01-09: 1000 mL via INTRAVENOUS
  Administered 2017-01-09 (×3): via INTRAVENOUS

## 2017-01-09 MED ORDER — DEXAMETHASONE SODIUM PHOSPHATE 10 MG/ML IJ SOLN
INTRAMUSCULAR | Status: AC
  Filled 2017-01-09: qty 1

## 2017-01-09 MED ORDER — OXYCODONE HCL 5 MG PO TABS: 5.0000 mg | ORAL_TABLET | Freq: Once | ORAL | Status: DC | PRN

## 2017-01-09 MED ORDER — THROMBIN (RECOMBINANT) 5000 UNITS EX SOLR
CUTANEOUS | Status: AC
  Filled 2017-01-09: qty 10000

## 2017-01-09 MED ORDER — METHOCARBAMOL 500 MG PO TABS
500.0000 mg | ORAL_TABLET | Freq: Four times a day (QID) | ORAL | Status: DC | PRN
  Administered 2017-01-10: 500 mg via ORAL
  Filled 2017-01-09 (×2): qty 1

## 2017-01-09 MED ORDER — MIDAZOLAM HCL 2 MG/2ML IJ SOLN
INTRAMUSCULAR | Status: AC
  Filled 2017-01-09: qty 2

## 2017-01-09 MED ORDER — MENTHOL 3 MG MT LOZG: 1.0000 | LOZENGE | OROMUCOSAL | Status: DC | PRN

## 2017-01-09 MED ORDER — MIDAZOLAM HCL 5 MG/5ML IJ SOLN
INTRAMUSCULAR | Status: DC | PRN
  Administered 2017-01-09: 2 mg via INTRAVENOUS

## 2017-01-09 MED ORDER — PANTOPRAZOLE SODIUM 40 MG PO TBEC
40.0000 mg | DELAYED_RELEASE_TABLET | Freq: Every day | ORAL | Status: DC
  Administered 2017-01-09 – 2017-01-10 (×2): 40 mg via ORAL
  Filled 2017-01-09 (×2): qty 1

## 2017-01-09 MED ORDER — OXYCODONE-ACETAMINOPHEN 5-325 MG PO TABS: 1.0000 | ORAL_TABLET | ORAL | 0 refills | Status: DC | PRN

## 2017-01-09 MED ORDER — POTASSIUM CHLORIDE IN NACL 20-0.45 MEQ/L-% IV SOLN
INTRAVENOUS | Status: DC
  Administered 2017-01-09: 18:00:00 via INTRAVENOUS
  Filled 2017-01-09 (×2): qty 1000

## 2017-01-09 MED ORDER — LIDOCAINE 2% (20 MG/ML) 5 ML SYRINGE
INTRAMUSCULAR | Status: AC
  Filled 2017-01-09: qty 5

## 2017-01-09 MED ORDER — POLYETHYLENE GLYCOL 3350 17 G PO PACK: 17.0000 g | PACK | Freq: Every day | ORAL | 0 refills | Status: DC

## 2017-01-09 MED ORDER — FENTANYL CITRATE (PF) 100 MCG/2ML IJ SOLN
INTRAMUSCULAR | Status: DC | PRN
  Administered 2017-01-09: 100 ug via INTRAVENOUS

## 2017-01-09 MED ORDER — PHENYLEPHRINE 40 MCG/ML (10ML) SYRINGE FOR IV PUSH (FOR BLOOD PRESSURE SUPPORT)
PREFILLED_SYRINGE | INTRAVENOUS | Status: DC | PRN
  Administered 2017-01-09 (×5): 80 ug via INTRAVENOUS

## 2017-01-09 MED ORDER — FENTANYL CITRATE (PF) 100 MCG/2ML IJ SOLN
25.0000 ug | INTRAMUSCULAR | Status: DC | PRN
  Administered 2017-01-09 (×2): 50 ug via INTRAVENOUS

## 2017-01-09 MED ORDER — SUGAMMADEX SODIUM 200 MG/2ML IV SOLN
INTRAVENOUS | Status: AC
  Filled 2017-01-09: qty 4

## 2017-01-09 MED ORDER — ONDANSETRON HCL 4 MG PO TABS: 4.0000 mg | ORAL_TABLET | Freq: Four times a day (QID) | ORAL | Status: DC | PRN

## 2017-01-09 MED ORDER — LORAZEPAM 0.5 MG PO TABS
0.5000 mg | ORAL_TABLET | Freq: Every day | ORAL | Status: DC
  Administered 2017-01-09: 0.5 mg via ORAL
  Filled 2017-01-09: qty 1

## 2017-01-09 MED ORDER — DEXAMETHASONE SODIUM PHOSPHATE 10 MG/ML IJ SOLN
INTRAMUSCULAR | Status: DC | PRN
  Administered 2017-01-09: 10 mg via INTRAVENOUS

## 2017-01-09 MED ORDER — ACETAMINOPHEN 650 MG RE SUPP: 650.0000 mg | RECTAL | Status: DC | PRN

## 2017-01-09 MED ORDER — BUPIVACAINE HCL (PF) 0.5 % IJ SOLN
INTRAMUSCULAR | Status: AC
  Filled 2017-01-09: qty 30

## 2017-01-09 MED ORDER — VANCOMYCIN HCL IN DEXTROSE 1-5 GM/200ML-% IV SOLN
1000.0000 mg | Freq: Once | INTRAVENOUS | Status: AC
  Administered 2017-01-09: 1000 mg via INTRAVENOUS
  Filled 2017-01-09: qty 200

## 2017-01-09 MED ORDER — POLYETHYLENE GLYCOL 3350 17 G PO PACK: 17.0000 g | PACK | Freq: Every day | ORAL | Status: DC | PRN

## 2017-01-09 MED ORDER — PHENOL 1.4 % MT LIQD
1.0000 | OROMUCOSAL | Status: DC | PRN
  Filled 2017-01-09: qty 177

## 2017-01-09 MED ORDER — FENTANYL CITRATE (PF) 100 MCG/2ML IJ SOLN
INTRAMUSCULAR | Status: AC
  Filled 2017-01-09: qty 2

## 2017-01-09 MED ORDER — ONDANSETRON HCL 4 MG/2ML IJ SOLN
INTRAMUSCULAR | Status: DC | PRN
  Administered 2017-01-09: 4 mg via INTRAVENOUS

## 2017-01-09 MED ORDER — ROCURONIUM BROMIDE 50 MG/5ML IV SOSY
PREFILLED_SYRINGE | INTRAVENOUS | Status: AC
  Filled 2017-01-09: qty 10

## 2017-01-09 MED ORDER — VANCOMYCIN HCL IN DEXTROSE 1-5 GM/200ML-% IV SOLN
1000.0000 mg | INTRAVENOUS | Status: AC
  Administered 2017-01-09: 1000 mg via INTRAVENOUS
  Filled 2017-01-09: qty 200

## 2017-01-09 MED ORDER — ACETAMINOPHEN 10 MG/ML IV SOLN
1000.0000 mg | INTRAVENOUS | Status: AC
  Administered 2017-01-09: 1000 mg via INTRAVENOUS
  Filled 2017-01-09: qty 100

## 2017-01-09 MED ORDER — THROMBIN (RECOMBINANT) 5000 UNITS EX SOLR
OROMUCOSAL | Status: DC | PRN
  Administered 2017-01-09: 10000 mL via TOPICAL

## 2017-01-09 MED ORDER — ACETAMINOPHEN 160 MG/5ML PO SOLN: 325.0000 mg | ORAL | Status: DC | PRN

## 2017-01-09 MED ORDER — HYDROMORPHONE HCL 1 MG/ML IJ SOLN: 1.0000 mg | INTRAMUSCULAR | Status: DC | PRN

## 2017-01-09 MED ORDER — POLYMYXIN B SULFATE 500000 UNITS IJ SOLR
INTRAMUSCULAR | Status: AC
  Filled 2017-01-09: qty 500000

## 2017-01-09 MED ORDER — SODIUM CHLORIDE 0.9 % IV SOLN
INTRAVENOUS | Status: DC | PRN
  Administered 2017-01-09: 500 mL

## 2017-01-09 MED ORDER — RISAQUAD PO CAPS
1.0000 | ORAL_CAPSULE | Freq: Every day | ORAL | Status: DC
  Administered 2017-01-10: 1 via ORAL
  Filled 2017-01-09: qty 1

## 2017-01-09 MED ORDER — OLMESARTAN MEDOXOMIL 5 MG PO TABS
10.0000 mg | ORAL_TABLET | Freq: Every day | ORAL | Status: DC
  Administered 2017-01-10: 10 mg via ORAL
  Filled 2017-01-09: qty 2

## 2017-01-09 MED ORDER — SUGAMMADEX SODIUM 200 MG/2ML IV SOLN
INTRAVENOUS | Status: DC | PRN
  Administered 2017-01-09: 200 mg via INTRAVENOUS

## 2017-01-09 MED ORDER — OXYCODONE HCL 5 MG PO TABS
10.0000 mg | ORAL_TABLET | ORAL | Status: DC | PRN
  Administered 2017-01-09 – 2017-01-10 (×3): 10 mg via ORAL
  Filled 2017-01-09 (×3): qty 2

## 2017-01-09 MED ORDER — DOCUSATE SODIUM 100 MG PO CAPS: 100.0000 mg | ORAL_CAPSULE | Freq: Two times a day (BID) | ORAL | 2 refills | Status: DC

## 2017-01-09 MED ORDER — ACETAMINOPHEN 500 MG PO TABS: 1000.0000 mg | ORAL_TABLET | Freq: Three times a day (TID) | ORAL | Status: DC | PRN

## 2017-01-09 MED ORDER — ONDANSETRON HCL 4 MG/2ML IJ SOLN
INTRAMUSCULAR | Status: AC
  Filled 2017-01-09: qty 2

## 2017-01-09 MED ORDER — ROCURONIUM BROMIDE 10 MG/ML (PF) SYRINGE
PREFILLED_SYRINGE | INTRAVENOUS | Status: DC | PRN
  Administered 2017-01-09: 20 mg via INTRAVENOUS
  Administered 2017-01-09: 5 mg via INTRAVENOUS
  Administered 2017-01-09: 50 mg via INTRAVENOUS
  Administered 2017-01-09: 10 mg via INTRAVENOUS

## 2017-01-09 MED ORDER — EPHEDRINE SULFATE-NACL 50-0.9 MG/10ML-% IV SOSY
PREFILLED_SYRINGE | INTRAVENOUS | Status: DC | PRN
  Administered 2017-01-09 (×2): 5 mg via INTRAVENOUS

## 2017-01-09 MED ORDER — NON FORMULARY: 10.0000 mg | Freq: Every morning | Status: DC

## 2017-01-09 MED ORDER — ONDANSETRON HCL 4 MG/2ML IJ SOLN: 4.0000 mg | Freq: Four times a day (QID) | INTRAMUSCULAR | Status: DC | PRN

## 2017-01-09 MED ORDER — OXYCODONE HCL 5 MG PO TABS: 5.0000 mg | ORAL_TABLET | ORAL | Status: DC | PRN

## 2017-01-09 MED ORDER — OXYCODONE HCL 5 MG/5ML PO SOLN
5.0000 mg | Freq: Once | ORAL | Status: DC | PRN
  Filled 2017-01-09: qty 5

## 2017-01-09 MED ORDER — LACTATED RINGERS IV SOLN: INTRAVENOUS | Status: DC

## 2017-01-09 SURGICAL SUPPLY — 48 items
BAG SPEC THK2 15X12 ZIP CLS (MISCELLANEOUS)
BAG ZIPLOCK 12X15 (MISCELLANEOUS) IMPLANT
CLEANER TIP ELECTROSURG 2X2 (MISCELLANEOUS) ×2 IMPLANT
CLOTH 2% CHLOROHEXIDINE 3PK (PERSONAL CARE ITEMS) ×2 IMPLANT
COVER SURGICAL LIGHT HANDLE (MISCELLANEOUS) ×2 IMPLANT
DRAPE MICROSCOPE LEICA (MISCELLANEOUS) ×2 IMPLANT
DRAPE POUCH INSTRU U-SHP 10X18 (DRAPES) ×2 IMPLANT
DRAPE SHEET LG 3/4 BI-LAMINATE (DRAPES) ×2 IMPLANT
DRAPE SURG 17X11 SM STRL (DRAPES) ×2 IMPLANT
DRAPE UTILITY XL STRL (DRAPES) ×2 IMPLANT
DRSG AQUACEL AG ADV 3.5X 4 (GAUZE/BANDAGES/DRESSINGS) IMPLANT
DRSG AQUACEL AG ADV 3.5X 6 (GAUZE/BANDAGES/DRESSINGS) ×2 IMPLANT
DURAPREP 26ML APPLICATOR (WOUND CARE) ×2 IMPLANT
DURASEAL SPINE SEALANT 3ML (MISCELLANEOUS) IMPLANT
ELECT BLADE TIP CTD 4 INCH (ELECTRODE) IMPLANT
ELECT REM PT RETURN 15FT ADLT (MISCELLANEOUS) ×2 IMPLANT
GLOVE BIOGEL PI IND STRL 7.0 (GLOVE) ×1 IMPLANT
GLOVE BIOGEL PI INDICATOR 7.0 (GLOVE) ×1
GLOVE SURG SS PI 7.0 STRL IVOR (GLOVE) ×2 IMPLANT
GLOVE SURG SS PI 7.5 STRL IVOR (GLOVE) ×4 IMPLANT
GLOVE SURG SS PI 8.0 STRL IVOR (GLOVE) ×4 IMPLANT
GOWN STRL REUS W/TWL XL LVL3 (GOWN DISPOSABLE) ×6 IMPLANT
HEMOSTAT SPONGE AVITENE ULTRA (HEMOSTASIS) IMPLANT
IV CATH 14GX2 1/4 (CATHETERS) ×2 IMPLANT
KIT BASIN OR (CUSTOM PROCEDURE TRAY) ×2 IMPLANT
KIT POSITIONING SURG ANDREWS (MISCELLANEOUS) ×2 IMPLANT
MANIFOLD NEPTUNE II (INSTRUMENTS) ×2 IMPLANT
NEEDLE SPNL 18GX3.5 QUINCKE PK (NEEDLE) ×4 IMPLANT
PACK LAMINECTOMY ORTHO (CUSTOM PROCEDURE TRAY) ×2 IMPLANT
PATTIES SURGICAL .5 X.5 (GAUZE/BANDAGES/DRESSINGS) IMPLANT
PATTIES SURGICAL .75X.75 (GAUZE/BANDAGES/DRESSINGS) IMPLANT
PATTIES SURGICAL 1X1 (DISPOSABLE) IMPLANT
RUBBERBAND STERILE (MISCELLANEOUS) ×4 IMPLANT
SPONGE SURGIFOAM ABS GEL 100 (HEMOSTASIS) ×2 IMPLANT
STAPLER VISISTAT (STAPLE) IMPLANT
STRIP CLOSURE SKIN 1/2X4 (GAUZE/BANDAGES/DRESSINGS) ×2 IMPLANT
SUT NURALON 4 0 TR CR/8 (SUTURE) IMPLANT
SUT PROLENE 3 0 PS 2 (SUTURE) ×2 IMPLANT
SUT VIC AB 1 CT1 27 (SUTURE)
SUT VIC AB 1 CT1 27XBRD ANTBC (SUTURE) IMPLANT
SUT VIC AB 1-0 CT2 27 (SUTURE) ×2 IMPLANT
SUT VIC AB 2-0 CT1 27 (SUTURE)
SUT VIC AB 2-0 CT1 TAPERPNT 27 (SUTURE) IMPLANT
SUT VIC AB 2-0 CT2 27 (SUTURE) ×2 IMPLANT
SYR 3ML LL SCALE MARK (SYRINGE) IMPLANT
TOWEL OR 17X26 10 PK STRL BLUE (TOWEL DISPOSABLE) ×2 IMPLANT
TOWEL OR NON WOVEN STRL DISP B (DISPOSABLE) IMPLANT
YANKAUER SUCT BULB TIP NO VENT (SUCTIONS) IMPLANT

## 2017-01-09 NOTE — Interval H&P Note (Signed)
History and Physical Interval Note:  01/09/2017 8:41 AM  Peter [name]  has presented today for surgery, with the diagnosis of Stenosis L4-5  The various methods of treatment have been discussed with the patient and family. After consideration of risks, benefits and other options for treatment, the patient has consented to  Procedure(s): Microlumbar decompression L4-5 (N/A) as a surgical intervention .  The patient's history has been reviewed, patient examined, no change in status, stable for surgery.  I have reviewed the patient's chart and labs.  Questions were answered to the patient's satisfaction.     BEANE,JEFFREY C

## 2017-01-09 NOTE — Progress Notes (Signed)
Brief Pharmacy note: Vancomycin dosing requested post-operatively, x1 dose 12 hr after pre-op dose, unless drain in place, then scheduled.  Vanc 1gm given ~ 0900, no drain per procedure note, Vanc 1gm at 2100  Minda Ditto PharmD Pager [phone] 01/09/2017, 1:59 PM

## 2017-01-09 NOTE — Brief Op Note (Signed)
01/09/2017  10:41 AM  PATIENT:  Peter [name]  66 y.o. male  PRE-OPERATIVE DIAGNOSIS:  Stenosis L4-5  POST-OPERATIVE DIAGNOSIS:  Stenosis L4-5  PROCEDURE:  Procedure(s): Microlumbar decompression L4-5 (N/A)  SURGEON:  Surgeon(s) and Role:    Susa Day, MD - Primary  PHYSICIAN ASSISTANT:   ASSISTANTS: [place]   ANESTHESIA:   spinal  EBL:  20 mL   BLOOD ADMINISTERED:none  DRAINS: none   LOCAL MEDICATIONS USED:  MARCAINE     SPECIMEN:  No Specimen  DISPOSITION OF SPECIMEN:  N/A  COUNTS:  YES  TOURNIQUET:  * No tourniquets in log *  DICTATION: .Other Dictation: Dictation Number [id number]  PLAN OF CARE: Admit for overnight observation  PATIENT DISPOSITION:  PACU - hemodynamically stable.   Delay start of Pharmacological VTE agent (>24hrs) due to surgical blood loss or risk of bleeding: yes

## 2017-01-09 NOTE — Transfer of Care (Signed)
Immediate Anesthesia Transfer of Care Note  Patient: Peter [name]  Procedure(s) Performed: Microlumbar decompression L4-5 (N/A Back)  Patient Location: PACU  Anesthesia Type:General  Level of Consciousness: awake, alert  and oriented  Airway & Oxygen Therapy: Patient Spontanous Breathing and Patient connected to face mask oxygen  Post-op Assessment: Report given to RN  Post vital signs: Reviewed and stable  Last Vitals:  Vitals:   01/09/17 0720  BP: 103/72  Pulse: 72  Resp: 16  Temp: 36.8 C  SpO2: 98%    Last Pain:  Vitals:   01/09/17 0858  TempSrc:   PainSc: 4       Patients Stated Pain Goal: 4 (27/61/84 8592)  Complications: No apparent anesthesia complications

## 2017-01-09 NOTE — Anesthesia Preprocedure Evaluation (Signed)
Anesthesia Evaluation  Patient identified by MRN, date of birth, ID band Patient awake    Reviewed: Allergy & Precautions, NPO status , Patient's Chart, lab work & pertinent test results  History of Anesthesia Complications Negative for: history of anesthetic complications  Airway Mallampati: II  TM Distance: >3 FB Neck ROM: Full    Dental  (+) Teeth Intact   Pulmonary neg pulmonary ROS,    breath sounds clear to auscultation       Cardiovascular hypertension, Pt. on medications (-) angina+ Peripheral Vascular Disease  (-) Past MI and (-) CHF  Rhythm:Regular     Neuro/Psych PSYCHIATRIC DISORDERS Anxiety negative neurological ROS     GI/Hepatic Neg liver ROS, GERD  Medicated and Controlled,  Endo/Other  diabetes, Type 2, Oral Hypoglycemic Agents  Renal/GU negative Renal ROS     Musculoskeletal  (+) Arthritis ,   Abdominal   Peds  Hematology   Anesthesia Other Findings   Reproductive/Obstetrics                             Anesthesia Physical Anesthesia Plan  ASA: II  Anesthesia Plan: General   Post-op Pain Management:    Induction: Intravenous  PONV Risk Score and Plan: 2 and Ondansetron and Dexamethasone  Airway Management Planned: Oral ETT  Additional Equipment: None  Intra-op Plan:   Post-operative Plan: Extubation in OR  Informed Consent: I have reviewed the patients History and Physical, chart, labs and discussed the procedure including the risks, benefits and alternatives for the proposed anesthesia with the patient or authorized representative who has indicated his/her understanding and acceptance.   Dental advisory given  Plan Discussed with: CRNA and Surgeon  Anesthesia Plan Comments:         Anesthesia Quick Evaluation

## 2017-01-09 NOTE — Discharge Instructions (Signed)

## 2017-01-09 NOTE — Interval H&P Note (Signed)
History and Physical Interval Note:  01/09/2017 8:42 AM  Peter [name]  has presented today for surgery, with the diagnosis of Stenosis L4-5  The various methods of treatment have been discussed with the patient and family. After consideration of risks, benefits and other options for treatment, the patient has consented to  Procedure(s): Microlumbar decompression L4-5 (N/A) as a surgical intervention .  The patient's history has been reviewed, patient examined, no change in status, stable for surgery.  I have reviewed the patient's chart and labs.  Questions were answered to the patient's satisfaction.     BEANE,JEFFREY C

## 2017-01-09 NOTE — Anesthesia Procedure Notes (Signed)
Procedure Name: Intubation Date/Time: 01/09/2017 9:25 AM Performed by: Lavina Hamman, CRNA Pre-anesthesia Checklist: Patient identified, Emergency Drugs available, Suction available, Patient being monitored and Timeout performed Patient Re-evaluated:Patient Re-evaluated prior to induction Oxygen Delivery Method: Circle system utilized Preoxygenation: Pre-oxygenation with 100% oxygen Induction Type: IV induction Ventilation: Mask ventilation without difficulty Laryngoscope Size: Mac and 4 Grade View: Grade I Tube type: Oral Tube size: 7.5 mm Number of attempts: 1 Airway Equipment and Method: Stylet Placement Confirmation: ETT inserted through vocal cords under direct vision,  positive ETCO2,  CO2 detector and breath sounds checked- equal and bilateral Secured at: 23 cm Tube secured with: Tape Dental Injury: Teeth and Oropharynx as per pre-operative assessment

## 2017-01-09 NOTE — Anesthesia Postprocedure Evaluation (Signed)
Anesthesia Post Note  Patient: Peter [name]  Procedure(s) Performed: Microlumbar decompression L4-5 (N/A Back)     Patient location during evaluation: PACU Anesthesia Type: General Level of consciousness: awake and alert Pain management: pain level controlled Vital Signs Assessment: post-procedure vital signs reviewed and stable Respiratory status: spontaneous breathing, nonlabored ventilation, respiratory function stable and patient connected to nasal cannula oxygen Cardiovascular status: blood pressure returned to baseline and stable Postop Assessment: no apparent nausea or vomiting Anesthetic complications: no    Last Vitals:  Vitals:   01/09/17 1437 01/09/17 1533  BP: 113/67 (!) 115/55  Pulse: (!) 58 70  Resp: 16 16  Temp: 37 C 37.1 C  SpO2: 98% 95%    Last Pain:  Vitals:   01/09/17 1533  TempSrc: Oral  PainSc:                  CHRISTOPHER MOSER

## 2017-01-09 NOTE — Interval H&P Note (Signed)
History and Physical Interval Note:  01/09/2017 8:28 AM  Peter [name]  has presented today for surgery, with the diagnosis of Stenosis L4-5  The various methods of treatment have been discussed with the patient and family. After consideration of risks, benefits and other options for treatment, the patient has consented to  Procedure(s): Microlumbar decompression L4-5 (N/A) as a surgical intervention .  The patient's history has been reviewed, patient examined, no change in status, stable for surgery.  I have reviewed the patient's chart and labs.  Questions were answered to the patient's satisfaction.     BEANE,JEFFREY C

## 2017-01-10 DIAGNOSIS — M48061 Spinal stenosis, lumbar region without neurogenic claudication: Secondary | ICD-10-CM | POA: Diagnosis not present

## 2017-01-10 LAB — BASIC METABOLIC PANEL
Anion gap: 7 (ref 5–15)
BUN: 21 mg/dL — ABNORMAL HIGH (ref 6–20)
CO2: 26 mmol/L (ref 22–32)
Calcium: 9.1 mg/dL (ref 8.9–10.3)
Chloride: 101 mmol/L (ref 101–111)
Creatinine, Ser: 1.25 mg/dL — ABNORMAL HIGH (ref 0.61–1.24)
GFR calc Af Amer: >60 mL/min (ref >60)
GFR calc non Af Amer: 58 mL/min — ABNORMAL LOW (ref >60)
Glucose, Bld: 142 mg/dL — ABNORMAL HIGH (ref 65–99)
Potassium: 4.3 mmol/L (ref 3.5–5.1)
Sodium: 134 mmol/L — ABNORMAL LOW (ref 135–145)

## 2017-01-10 LAB — GLUCOSE, CAPILLARY
Glucose-Capillary: 133 mg/dL — ABNORMAL HIGH (ref 65–99)
Glucose-Capillary: 128 mg/dL — ABNORMAL HIGH (ref 65–99)

## 2017-01-10 NOTE — Progress Notes (Signed)
Subjective: 1 Day Post-Op Procedure(s) (LRB): Microlumbar decompression L4-5 (N/A) Patient reports pain as 2 on 0-10 scale.   Minimal leg pain. Voiding well. Objective: Vital signs in last 24 hours: Temp:  [98 F (36.7 C)-98.9 F (37.2 C)] 98.5 F (36.9 C) (12/27 0612) Pulse Rate:  [58-80] 63 (12/27 0612) Resp:  [10-17] 16 (12/27 0612) BP: (92-142)/(42-78) 108/64 (12/27 0612) SpO2:  [94 %-98 %] 95 % (12/27 0612)  Intake/Output from previous day: 12/26 0701 - 12/27 0700 In: 2580 [P.O.:680; I.V.:1900] Out: 1421 [Urine:1401; Blood:20] Intake/Output this shift: No intake/output data recorded.  No results for input(s): HGB in the last 72 hours. No results for input(s): WBC, RBC, HCT, PLT in the last 72 hours. Recent Labs    01/10/17 0453  NA 134*  K 4.3  CL 101  CO2 26  BUN 21*  CREATININE 1.25*  GLUCOSE 142*  CALCIUM 9.1   No results for input(s): LABPT, INR in the last 72 hours.  Neurologically intact Neurovascular intact Sensation intact distally Intact pulses distally Incision: scant drainage Dried blood. Assessment/Plan: 1 Day Post-Op Procedure(s) (LRB): Microlumbar decompression L4-5 (N/A) Discharge home with home health  D/C instructions given., Dressing change prior  BEANE,JEFFREY C 01/10/2017, 7:40 AM

## 2017-01-10 NOTE — Progress Notes (Signed)
Discharge planning, no HH needs identified. Per OT, needs 3n1. Contacted [facility] to deliver to room. [phone]

## 2017-01-10 NOTE — Progress Notes (Signed)
Assessment unchanged. Pt verbalized understanding of dc instructions through teach back including when to call MD, follow up care, and activity to resume. Scripts x 3 given as provided by MD. Aquacel dressing applied to lower back incision prior to dc per MD order. Discharged via wc to front entrance accompanied by NT and wife.

## 2017-01-10 NOTE — Op Note (Signed)
NAMEDREY, SHAFF NO.:  [id number]  MEDICAL RECORD NO.:  [id number]  LOCATION:                                 FACILITY:  PHYSICIAN:  Susa Day, M.D.         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  PROCEDURE PERFORMED:  Bilateral hemilaminotomy and bilateral L4 and L5 foraminotomy.  ANESTHESIA:  General.  ASSISTANT:  Cleophas Dunker, PA.  HISTORY:  A 66 year old bilateral right greater than left lower extremity radicular pain, spinal stenosis, disk degeneration, neural foraminal narrowing.  He particularly had symptoms on the right related severe lateral recess stenosis, multifactorial.  He is indicated for microlumbar decompression L5-S1 mainly due to facet, ligamentum flavum, hypertrophy, and neuroforaminal narrowing.  Risks and benefits were discussed including bleeding, infection, damage to neurovascular structures, no change in symptoms, worsening symptoms, DVT, PE, anesthetic complications, etc., as well as need for fusion.  TECHNIQUE:  With the patient in supine position after induction of adequate general anesthesia, 2 g Kefzol, placed prone on the Foss frame.  All bony prominences were well padded.  Lumbar region was prepped and draped in usual sterile fashion.  Two 18-gauge spinal needle was utilized to localize L4-5 interspace confirmed with x-ray.  Incision was made from spinous process L4 to below L5.  Subcutaneous tissue was dissected.  Electrocautery was utilized to achieve hemostasis. Dorsolumbar fascia was divided in line skin incision after infiltration with 0.25% Marcaine with epinephrine in the peri spinous musculature. McCullough retractor was placed.  Operating microscope was draped and brought on the surgical field,  removed the portions of the spinous process of L4 and L5 with Leksell rongeur due to the absence of interlaminar space.  We then performed hemilaminotomy of the caudad edge of L4 bilaterally.  Straight curette utilized to detach ligamentum flavum from the cephalad edge of L5 bilaterally.  Neural elements well protected.  I performed generous foraminotomy and hemilaminotomy of the cephalad edge of L5.  Then from both sides of the operating room table, we decompressed lateral recess to medial border of the pedicle severe on the right, moderate on the left.  Extending into the neural foramen, we performed foraminotomy bilaterally removing the portion of the superior articulating process of L5.  We decompressed cephalad to detach the ligamentum flavum.  I inspected the disk on both sides.  There was a disk osteophyte complex, hard and soft tissue herniation. Foraminotomies again were performed at L4.  Copiously irrigated with antibiotic irrigation.  Confirmatory radiograph obtained, instruments in the L4 and L5 foramen.  Bipolar cautery had been utilized to achieve hemostasis.  Thrombin-soaked Gelfoam was placed in laminotomy defect. McCullough retractor was moved.  No active bleeding or CSF leakage. Dorsolumbar fascia reapproximated 1 Vicryl, subcu with 2, and skin with Prolene.  Sterile dressing applied.  Placed supine on the hospital bed, extubated without difficulty and transported to the recovery room in satisfactory condition.  The patient tolerated the procedure well.  No complications.  Assistant,  Cleophas Dunker, [place].  Minimal blood loss.     Susa Day, M.D.   ______________________________ Susa Day, M.D.    Geralynn Rile  D:  01/09/2017  T:  01/09/2017  Job:  [id number]

## 2017-01-10 NOTE — Evaluation (Signed)
Occupational Therapy Evaluation and Discharge Patient Details Name: Peter [name] MRN: [id number] DOB: 11/10/1950 Today's Date: 01/10/2017    History of Present Illness s/p micro lumbar decompression L4-L5. PMH: HTN, PVD, DM, arthritis.   Clinical Impression   Pt with increased pain and moving slowly, but able to demonstrate ability to perform ADL and ADL transfers modified independently and verbalize understanding of back precautions. Recommending 3 in 1 as pt has significant difficulty maintaining back precautions. No further OT needs.    Follow Up Recommendations  No OT follow up    Equipment Recommendations  3 in 1 bedside commode    Recommendations for Other Services       Precautions / Restrictions Precautions Precautions: Back Precaution Booklet Issued: Yes (comment)      Mobility Bed Mobility Overal bed mobility: Modified Independent             General bed mobility comments: used log roll method, increased time  Transfers Overall transfer level: Modified independent               General transfer comment: supervision from toilet    Balance                                           ADL either performed or assessed with clinical judgement   ADL Overall ADL's : Needs assistance/impaired Eating/Feeding: Independent;Sitting   Grooming: Oral care;Standing;Modified independent Grooming Details (indicate cue type and reason): instructed in two cup method Upper Body Bathing: Set up;Sitting Upper Body Bathing Details (indicate cue type and reason): instructed to avoid twisting with washing back Lower Body Bathing: Sit to/from stand;Modified independent   Upper Body Dressing : Set up;Sitting   Lower Body Dressing: Modified independent;Sit to/from stand Lower Body Dressing Details (indicate cue type and reason): can cross foot over opposite knee Toilet Transfer: Supervision/safety;Ambulation Toilet Transfer Details (indicate cue  type and reason): educated pt in use of 3 in 1 Toileting- Clothing Manipulation and Hygiene: Modified independent;Sit to/from stand Toileting - Clothing Manipulation Details (indicate cue type and reason): instructed to avoid twist with pericare     Functional mobility during ADLs: Modified independent General ADL Comments: instructed in back precautions related to IADL     Vision Baseline Vision/History: Wears glasses;Macular Degeneration Wears Glasses: At all times Patient Visual Report: No change from baseline       Perception     Praxis      Pertinent Vitals/Pain Pain Assessment: 0-10 Pain Score: 7  Pain Location: back Pain Descriptors / Indicators: Aching;Grimacing;Guarding Pain Intervention(s): Monitored during session     Hand Dominance Right   Extremity/Trunk Assessment Upper Extremity Assessment Upper Extremity Assessment: Overall WFL for tasks assessed   Lower Extremity Assessment Lower Extremity Assessment: Defer to PT evaluation   Cervical / Trunk Assessment Cervical / Trunk Assessment: Other exceptions Cervical / Trunk Exceptions: s/p back sx   Communication Communication Communication: No difficulties   Cognition Arousal/Alertness: Awake/alert Behavior During Therapy: WFL for tasks assessed/performed Overall Cognitive Status: Within Functional Limits for tasks assessed                                     General Comments       Exercises     Shoulder Instructions      Home Living Family/patient expects  to be discharged to:: Private residence Living Arrangements: Spouse/significant other Available Help at Discharge: Family Type of Home: House Home Access: Stairs to enter Technical brewer of Steps: 5 Entrance Stairs-Rails: Left Home Layout: One level     Bathroom Shower/Tub: Occupational psychologist: [place]: Shower seat          Prior Functioning/Environment Level of  Independence: Independent        Comments: farms        OT Problem List: Pain      OT Treatment/Interventions:      OT Goals(Current goals can be found in the care plan section) Acute Rehab OT Goals Patient Stated Goal: pain relief long term OT Goal Formulation: With patient  OT Frequency:     Barriers to D/C:            Co-evaluation              AM-PAC PT "6 Clicks" Daily Activity     Outcome Measure Help from another person eating meals?: None Help from another person taking care of personal grooming?: None Help from another person toileting, which includes using toliet, bedpan, or urinal?: None Help from another person bathing (including washing, rinsing, drying)?: None Help from another person to put on and taking off regular upper body clothing?: None Help from another person to put on and taking off regular lower body clothing?: None 6 Click Score: 24   End of Session    Activity Tolerance: Patient tolerated treatment well Patient left: in bed;with call bell/phone within reach  OT Visit Diagnosis: Pain                Time: 4196-2229 OT Time Calculation (min): 24 min Charges:  OT General Charges $OT Visit: 1 Visit OT Evaluation $OT Eval Low Complexity: 1 Low OT Treatments $Self Care/Home Management : 8-22 mins G-Codes: OT G-codes **NOT FOR INPATIENT CLASS** Functional Assessment Tool Used: Clinical judgement Functional Limitation: Self care Self Care Current Status (N9892): At least 1 percent but less than 20 percent impaired, limited or restricted Self Care Goal Status (J1941): At least 1 percent but less than 20 percent impaired, limited or restricted Self Care Discharge Status [phone]): At least 1 percent but less than 20 percent impaired, limited or restricted   01/10/2017 [name], OTR/L Pager: [phone] [name] 01/10/2017, 9:56 AM

## 2017-01-10 NOTE — Progress Notes (Signed)
   01/10/17 1000  PT Time Calculation  PT Start Time (ACUTE ONLY) 0805  PT Stop Time (ACUTE ONLY) 0827  PT Time Calculation (min) (ACUTE ONLY) 22 min  PT G-Codes **NOT FOR INPATIENT CLASS**  Functional Assessment Tool Used Clinical judgement  Functional Limitation Mobility: Walking and moving around  Mobility: Walking and Moving Around Current Status (A1287) CJ  Mobility: Walking and Moving Around Goal Status (O6767) CJ  Mobility: Walking and Moving Around Discharge Status (M0947) CJ  PT General Charges  $$ ACUTE PT VISIT 1 Visit  PT Evaluation  $PT Eval Low Complexity 1 Low

## 2017-01-10 NOTE — Evaluation (Signed)
Physical Therapy Evaluation Patient Details Name: Peter [name] MRN: [id number] DOB: [birth year] Today's Date: 01/10/2017   History of Present Illness  s/p micro lumbar decompression L4-L5. PMH: HTN, PVD, DM, arthritis.  Clinical Impression  Pt s/p back surgery and presents with post op pain and back precautions limiting functional mobility.  Pt is currently mobilizing at sup to MOD I level and plans dc home this date with assist of family.    Follow Up Recommendations No PT follow up    Equipment Recommendations  None recommended by PT    Recommendations for Other Services       Precautions / Restrictions Precautions Precautions: Back Precaution Booklet Issued: Yes (comment) Precaution Comments: Back precautions reviewed x 3 Restrictions Weight Bearing Restrictions: No      Mobility  Bed Mobility Overal bed mobility: Needs Assistance Bed Mobility: Supine to Sit     Supine to sit: Min guard;Supervision     General bed mobility comments: Increased time with multimodal cues for sequence and correct log roll technique  Transfers Overall transfer level: Needs assistance   Transfers: Sit to/from Stand Sit to Stand: Min guard;Supervision         General transfer comment: cues for transition position, use of UEs to self assist and adherence to back precautions.  Ambulation/Gait Ambulation/Gait assistance: Min guard;Supervision Ambulation Distance (Feet): 500 Feet Assistive device: None Gait Pattern/deviations: Step-through pattern;Decreased step length - right;Decreased step length - left;Shuffle;Wide base of support;Decreased stance time - right Gait velocity: decr Gait velocity interpretation: Below normal speed for age/gender General Gait Details: mild general instability but no LOB - pt advises this is his norm if he walks without shoes and orthotics  Stairs Stairs: Yes Stairs assistance: Min guard Stair Management: One rail Right;Forwards;Step to  pattern Number of Stairs: 3    Wheelchair Mobility    Modified [name] (Stroke Patients Only)       Balance Overall balance assessment: Needs assistance Sitting-balance support: No upper extremity supported;Feet supported Sitting balance-Leahy Scale: Good     Standing balance support: No upper extremity supported Standing balance-Leahy Scale: Good                               Pertinent Vitals/Pain Pain Assessment: 0-10 Pain Score: 6  Pain Location: back Pain Descriptors / Indicators: Aching;Sore;Guarding Pain Intervention(s): Limited activity within patient's tolerance;Monitored during session;Premedicated before session;Repositioned    Home Living Family/patient expects to be discharged to:: Private residence Living Arrangements: Spouse/significant other Available Help at Discharge: Family Type of Home: House Home Access: Stairs to enter Entrance Stairs-Rails: Left Entrance Stairs-Number of Steps: 5 Home Layout: One level Home Equipment: Shower seat      Prior Function Level of Independence: Independent         Comments: farms     Hand Dominance   Dominant Hand: Right    Extremity/Trunk Assessment   Upper Extremity Assessment Upper Extremity Assessment: Overall WFL for tasks assessed    Lower Extremity Assessment Lower Extremity Assessment: Overall WFL for tasks assessed    Cervical / Trunk Assessment Cervical / Trunk Assessment: Other exceptions Cervical / Trunk Exceptions: s/p back sx  Communication   Communication: No difficulties  Cognition Arousal/Alertness: Awake/alert Behavior During Therapy: WFL for tasks assessed/performed Overall Cognitive Status: Within Functional Limits for tasks assessed  General Comments      Exercises     Assessment/Plan    PT Assessment Patent does not need any further PT services  PT Problem List         PT Treatment Interventions       PT Goals (Current goals can be found in the Care Plan section)  Acute Rehab PT Goals Patient Stated Goal: pain relief long term    Frequency     Barriers to discharge        Co-evaluation               AM-PAC PT "6 Clicks" Daily Activity  Outcome Measure Difficulty turning over in bed (including adjusting bedclothes, sheets and blankets)?: A Little Difficulty moving from lying on back to sitting on the side of the bed? : A Lot Difficulty sitting down on and standing up from a chair with arms (e.g., wheelchair, bedside commode, etc,.)?: A Little Help needed moving to and from a bed to chair (including a wheelchair)?: A Little Help needed walking in hospital room?: A Little Help needed climbing 3-5 steps with a railing? : A Little 6 Click Score: 17    End of Session   Activity Tolerance: Patient tolerated treatment well Patient left: in chair;with call bell/phone within reach Nurse Communication: Mobility status PT Visit Diagnosis: Difficulty in walking, not elsewhere classified (R26.2)    Time: 5409-8119 PT Time Calculation (min) (ACUTE ONLY): 22 min   Charges:         PT G Codes:        Pg [phone]   Cortney Beissel 01/10/2017, 10:59 AM

## 2017-02-14 ENCOUNTER — Ambulatory Visit: Payer: Medicare Other | Admitting: Physical Therapy

## 2017-02-18 ENCOUNTER — Encounter: Payer: Self-pay | Admitting: Physical Therapy

## 2017-02-18 ENCOUNTER — Ambulatory Visit: Payer: BLUE CROSS/BLUE SHIELD | Attending: Specialist | Admitting: Physical Therapy

## 2017-02-18 ENCOUNTER — Other Ambulatory Visit: Payer: Self-pay

## 2017-02-18 DIAGNOSIS — M6281 Muscle weakness (generalized): Secondary | ICD-10-CM | POA: Diagnosis present

## 2017-02-18 DIAGNOSIS — M545 Low back pain: Secondary | ICD-10-CM | POA: Insufficient documentation

## 2017-02-18 NOTE — Patient Instructions (Signed)
   Peter [name], PT, DPT [facility] [address] [place], [place], [address] Phone: [phone]   Fax:  [phone]  

## 2017-02-18 NOTE — Therapy (Signed)
[facility] [place], [place], [address] Phone: [phone]   Fax:  [phone]  Physical Therapy Evaluation  Patient Details  Name: Peter [name] MRN: [id number] Date of Birth: 27-Feb-1950 Referring Provider: Susa Day   Encounter Date: 02/18/2017  PT End of Session - 02/18/17 0914    Visit Number  1    Number of Visits  12    Date for PT Re-Evaluation  03/18/17    Authorization Type  FOTO every 5th visit    PT Start Time  0905    PT Stop Time  0947    PT Time Calculation (min)  42 min    Activity Tolerance  Patient tolerated treatment well    Behavior During Therapy  [facility] for tasks assessed/performed       Past Medical History:  Diagnosis Date  . AAA (abdominal aortic aneurysm) ([place])   . AAA (abdominal aortic aneurysm) ([place])    has graft (Dr. at [facility])  . Anxiety   . Arthritis   . Best vitelliform macular dystrophy    folowed at [facility]  . Diabetes mellitus without complication ([place])    type 2  . Foot pain   . GERD (gastroesophageal reflux disease)   . History of kidney stones   . Macular degeneration   . Palpitations   . Varicose veins     Past Surgical History:  Procedure Laterality Date  . ABDOMINAL AORTIC ANEURYSM REPAIR  2008  . ENDOVENOUS ABLATION SAPHENOUS VEIN W/ LASER  05-24-2011   left greater saphenous vein and stab phlebectomy left leg >20 incisions  . ENDOVENOUS ABLATION SAPHENOUS VEIN W/ LASER  06-07-2011   left small saphenous Curt Jews MD  . LUMBAR LAMINECTOMY/DECOMPRESSION MICRODISCECTOMY N/A 01/09/2017   Procedure: Microlumbar decompression L4-5;  Surgeon: Susa Day, MD;  Location: [facility];  Service: Orthopedics;  Laterality: N/A;  . mastoid reconstruction     right 65 Dr. Rock Nephew  . ROTATOR CUFF REPAIR    . TYMPANOPLASTY     bil    There were no vitals filed for this visit.   Subjective Assessment - 02/18/17 1156    Subjective  Patient arrives to physical therapy  with complaints of back pain and LE weakness s/p lumbar L4-L5 decompression on 01/09/2017. Patient reports difficulty standing, ascending/descending steps, and walking for greater than 45 minutes. At worst, low back pain can rise to 6/10 pain with standing and ascending/descending steps. At best, pain is 0/10 while lying down on back or sidelying and with Advil for pain relief. Patient is cognizant of protocol: no bending, lifting, or twisting. Patient's PT goal is to "get rid of pain and get back to moving like before."    Limitations  House hold activities;Lifting;Standing    How long can you sit comfortably?  1 hour    How long can you stand comfortably?  Less than 5 minutes    How long can you walk comfortably?  45 minutes    Patient Stated Goals  get back to moving like before    Currently in Pain?  Yes    Pain Score  4     Pain Location  Back    Pain Orientation  Lower    Pain Descriptors / Indicators  Aching;Dull    Pain Type  Acute pain;Surgical pain    Pain Onset  More than a month ago    Pain Frequency  Intermittent    Aggravating Factors   standing and steps  Pain Relieving Factors  Medication and lying down.         [facility] PT Assessment - 02/18/17 0001      Assessment   Medical Diagnosis  Lumbar decompression    Referring Provider  Susa Day    Onset Date/Surgical Date  01/09/17    Next MD Visit  April 2019    Prior Therapy  none      Precautions   Precautions  Back    Precaution Comments  No bending lifting and twisting      Balance Screen   Has the patient fallen in the past 6 months  No    Has the patient had a decrease in activity level because of a fear of falling?   Yes    Is the patient reluctant to leave their home because of a fear of falling?   No      Home Environment   Living Environment  Private residence    Living Arrangements  Spouse/significant other    Type of [place] to enter    Entrance Stairs-Number of Steps  5  steps into house from garage, 1 step at front door.    Entrance Stairs-Rails  Right;Left;Cannot reach both    Home Layout  One level      Prior Function   Level of Independence  Independent    Vocation  Retired      Observation/Other Assessments   Skin Integrity  2 inch scar with minimal scabbing at lumbar spine    Focus on Therapeutic Outcomes (FOTO)   57% limitation      Sensation   Light Touch  Appears Intact      Posture/Postural Control   Posture Comments  rounded shoulders, increased kyphosis in sitting      ROM / Strength   AROM / PROM / Strength  AROM;Strength      AROM   Overall AROM Comments  AROM grossly assessed within protocol, knee and ankle WNL      Strength   Overall Strength  Within functional limits for tasks performed    Strength Assessment Site  Hip;Knee;Ankle    Right/Left Hip  Right;Left    Right Hip Flexion  4/5    Right Hip ABduction  4/5    Left Hip Flexion  4/5    Left Hip ABduction  4/5    Right/Left Knee  Right;Left    Right Knee Flexion  4/5    Right Knee Extension  4+/5    Left Knee Flexion  4/5    Left Knee Extension  4/5    Right/Left Ankle  Right;Left    Right Ankle Dorsiflexion  5/5    Left Ankle Dorsiflexion  5/5      Bed Mobility   Bed Mobility  -- VC for log rolling to prevent breaking twisting protocol      Transfers   Transfers  Independent with all Transfers    Five time sit to stand comments   21 seconds norm: 11sec, decreased strength and fall risk    Comments  --      Ambulation/Gait   Gait Pattern  Step-through pattern;Trunk flexed;Wide base of support      Standardized Balance Assessment   Standardized Balance Assessment  -- Romberg (+) increased AP sway with feet together, eyes close             Objective measurements completed on examination: See above findings.  PT Education - 02/18/17 1213    Education provided  Yes    Education Details  Draw in HEP    Person(s) Educated   Patient    Methods  Explanation;Demonstration;Verbal cues;Tactile cues;Handout    Comprehension  Verbalized understanding;Returned demonstration       PT Short Term Goals - 02/18/17 1226      PT SHORT TERM GOAL #1   Title  STG=LTG        PT Long Term Goals - 02/18/17 1226      PT LONG TERM GOAL #1   Title  Patient will be independent with HEP    Time  4    Period  Weeks    Status  New    Target Date  03/18/17      PT LONG TERM GOAL #2   Title  Patient will improve bilateral hip flexion strength to 5/5 to improve functional activities    Time  4    Period  Weeks    Status  New    Target Date  03/18/17      PT LONG TERM GOAL #3   Title  Patient will ascend/descend  steps with reciprocating gait and one railing with <2/10 low back pain in order to enter/exit home.    Time  4    Period  Weeks    Status  New    Target Date  03/18/17      PT LONG TERM GOAL #4   Title  Patient will increase bilateral knee flexion strength to 5/5 to improve stability for functional activities.    Time  4    Period  Weeks    Status  New    Target Date  03/18/17      PT LONG TERM GOAL #5   Title  Patient will decrease at worst pain to less than or equal to 2/10 pain while performing functional activities.    Time  4    Period  Weeks    Status  New    Target Date  03/18/17             Plan - 02/18/17 1222    Clinical Impression Statement  Patient is a 67 year old male who presents to physical therapy s/p L4-L5 decompression on 01/09/2017. Patient presents with bilateral LE weakness. Patient's 5x sit to stand score of 21 seconds categorizes patient at increased risk of falls and overall LE weakness. Patient would benefit from skilled physical therapy to improve both LE weakness and improve balance. Patient is cognizant of lumbar protocol (no bending lifting or twisting). PT and patient discussed importance of following protocols to allow healing, getting help with ADLs, and not to push  household activities that may break precautions.     History and Personal Factors relevant to plan of care:  HTN, Diabetes, AAA graft, vision impairments    Clinical Presentation  Stable    Clinical Decision Making  Low    Rehab Potential  Good    Clinical Impairments Affecting Rehab Potential  lumbar decompression 01/09/17, No bending, lifting or twisting.    PT Frequency  3x / week    PT Duration  4 weeks    PT Treatment/Interventions  ADLs/Self Care Home Management;Electrical Stimulation;Iontophoresis 4mg /ml Dexamethasone;Moist Heat;Therapeutic exercise;Therapeutic activities;Functional mobility training;Stair training;Gait training;Balance training;Neuromuscular re-education;Patient/family education;Manual techniques;Scar mobilization;Passive range of motion;Dry needling    PT Next Visit Plan  Begin on Nustep as warm up, no use of UE, core strengthening/stabilization and LE  strengthening    PT Home Exercise Plan  Draw ins    Consulted and Agree with Plan of Care  Patient       Patient will benefit from skilled therapeutic intervention in order to improve the following deficits and impairments:  Pain, Abnormal gait, Decreased strength, Decreased activity tolerance  Visit Diagnosis: Low back pain, unspecified back pain laterality, unspecified chronicity, with sciatica presence unspecified  Muscle weakness (generalized)     Problem List Patient Active Problem List   Diagnosis Date Noted  . Lumbar spinal stenosis 01/09/2017  . Spinal stenosis at L4-L5 level 01/09/2017  . Varicose veins of lower extremities with other complications 36/68/1594  . Chronic venous insufficiency 12/15/2010   Gabriela Eves, PT, DPT 02/18/2017, 6:43 PM  [facility] [address] [place], [place], [address] Phone: [phone]   Fax:  [phone]  Name: Peter [name] MRN: [id number] Date of Birth: 1950-10-07

## 2017-02-26 ENCOUNTER — Encounter: Payer: Medicare Other | Admitting: Physical Therapy

## 2017-02-28 ENCOUNTER — Encounter: Payer: Self-pay | Admitting: Physical Therapy

## 2017-02-28 ENCOUNTER — Ambulatory Visit: Payer: BLUE CROSS/BLUE SHIELD | Admitting: Physical Therapy

## 2017-02-28 DIAGNOSIS — M545 Low back pain: Secondary | ICD-10-CM

## 2017-02-28 DIAGNOSIS — M6281 Muscle weakness (generalized): Secondary | ICD-10-CM

## 2017-02-28 NOTE — Patient Instructions (Signed)
Pelvic Tilt: Posterior - Legs Bent (Supine)  Tighten stomach and flatten back by rolling pelvis down. Hold _10___ seconds. Relax. Repeat _10-30___ times per set. Do __2__ sets per session. Do _2___ sessions per day.   Bent Leg Lift (Hook-Lying)  Tighten stomach and slowly raise right leg _5___ inches from floor. Keep trunk rigid. Hold _3___ seconds. Repeat _10___ times per set. Do ___2-3_ sets per session. Do __2__ sessions per day.   Straight Leg Raise  Tighten stomach and slowly raise locked right leg __4__ inches from floor. Repeat __10-30__ times per set. Do __2__ sets per session. Do __2__ sessions per day.

## 2017-02-28 NOTE — Therapy (Signed)
[facility] [place], [place], [address] Phone: [phone]   Fax:  [phone]  Physical Therapy Treatment  Patient Details  Name: Peter [name] MRN: [id number] Date of Birth: [birth year] Referring Provider: Susa Day   Encounter Date: 02/28/2017  PT End of Session - 02/28/17 0851    Visit Number  2    Number of Visits  12    Date for PT Re-Evaluation  03/18/17    Authorization Type  FOTO every 5th visit    PT Start Time  0823    PT Stop Time  0903    PT Time Calculation (min)  40 min    Activity Tolerance  Patient tolerated treatment well    Behavior During Therapy  [facility] for tasks assessed/performed       Past Medical History:  Diagnosis Date  . AAA (abdominal aortic aneurysm) ([place])   . AAA (abdominal aortic aneurysm) ([place])    has graft (Dr. at [facility])  . Anxiety   . Arthritis   . Best vitelliform macular dystrophy    folowed at [facility]  . Diabetes mellitus without complication ([place])    type 2  . Foot pain   . GERD (gastroesophageal reflux disease)   . History of kidney stones   . Macular degeneration   . Palpitations   . Varicose veins     Past Surgical History:  Procedure Laterality Date  . ABDOMINAL AORTIC ANEURYSM REPAIR  2008  . ENDOVENOUS ABLATION SAPHENOUS VEIN W/ LASER  05-24-2011   left greater saphenous vein and stab phlebectomy left leg >20 incisions  . ENDOVENOUS ABLATION SAPHENOUS VEIN W/ LASER  06-07-2011   left small saphenous Curt Jews MD  . LUMBAR LAMINECTOMY/DECOMPRESSION MICRODISCECTOMY N/A 01/09/2017   Procedure: Microlumbar decompression L4-5;  Surgeon: Susa Day, MD;  [facility];  Service: Orthopedics;  Laterality: N/A;  . mastoid reconstruction     right 65 Dr. Rock Nephew  . ROTATOR CUFF REPAIR    . TYMPANOPLASTY     bil    There were no vitals filed for this visit.  Subjective Assessment - 02/28/17 0825    Subjective  Patient arrived with minimal pain  today    Limitations  House hold activities;Lifting;Standing    How long can you sit comfortably?  1 hour    How long can you stand comfortably?  Less than 5 minutes    How long can you walk comfortably?  45 minutes    Patient Stated Goals  get back to moving like before    Currently in Pain?  Yes    Pain Score  2     Pain Location  Back    Pain Orientation  Lower    Pain Descriptors / Indicators  Aching    Pain Type  Surgical pain    Pain Onset  More than a month ago    Pain Frequency  Intermittent    Aggravating Factors   bending and steps    Pain Relieving Factors  meds and rest                      [facility] Adult PT Treatment/Exercise - 02/28/17 0001      Self-Care   Self-Care  ADL's;Lifting;Posture;Other Self-Care Comments    ADL's  brusing teeth, cleaning and technique for farm work    Lifting  power lift and light weight only per MD    Posture  all positions  Exercises   Exercises  Lumbar;Knee/Hip      Lumbar Exercises: Aerobic   Nustep  x56min L5 LE only      Lumbar Exercises: Supine   Ab Set  20 reps;3 seconds    Glut Set  20 reps;3 seconds    Clam  20 reps;3 seconds    Bent Knee Raise  3 seconds 2x10    Straight Leg Raise  3 seconds 2x10      Knee/Hip Exercises: Sidelying   Hip ABduction  Strengthening;Both;1 set;10 reps             PT Education - 02/28/17 0836    Education provided  Yes    Education Details  HEP draw in progression    Person(s) Educated  Patient    Methods  Explanation;Demonstration;Handout    Comprehension  Verbalized understanding;Returned demonstration       PT Short Term Goals - 02/18/17 1226      PT SHORT TERM GOAL #1   Title  STG=LTG        PT Long Term Goals - 02/18/17 1226      PT LONG TERM GOAL #1   Title  Patient will be independent with HEP    Time  4    Period  Weeks    Status  New    Target Date  03/18/17      PT LONG TERM GOAL #2   Title  Patient will improve bilateral hip flexion  strength to 5/5 to improve functional activities    Time  4    Period  Weeks    Status  New    Target Date  03/18/17      PT LONG TERM GOAL #3   Title  Patient will ascend/descend  steps with reciprocating gait and one railing with <2/10 low back pain in order to enter/exit home.    Time  4    Period  Weeks    Status  New    Target Date  03/18/17      PT LONG TERM GOAL #4   Title  Patient will increase bilateral knee flexion strength to 5/5 to improve stability for functional activities.    Time  4    Period  Weeks    Status  New    Target Date  03/18/17      PT LONG TERM GOAL #5   Title  Patient will decrease at worst pain to less than or equal to 2/10 pain while performing functional activities.    Time  4    Period  Weeks    Status  New    Target Date  03/18/17            Plan - 02/28/17 0855    Clinical Impression Statement  Patient tolerated treatment well today. Patient was educated on posture awareness techniques and core activation exercises. HEP provided for core progression exercises. Patient able to complete all exercises with good understanding and technique with no increased pain. Modalities required today. Goals progressing.     Rehab Potential  Good    Clinical Impairments Affecting Rehab Potential  lumbar decompression 01/09/17, No bending, lifting or twisting.    PT Frequency  3x / week    PT Duration  4 weeks    PT Treatment/Interventions  ADLs/Self Care Home Management;Electrical Stimulation;Iontophoresis 4mg /ml Dexamethasone;Moist Heat;Therapeutic exercise;Therapeutic activities;Functional mobility training;Stair training;Gait training;Balance training;Neuromuscular re-education;Patient/family education;Manual techniques;Scar mobilization;Passive range of motion;Dry needling    PT Next Visit Plan  cont with POC for Nustep LE only, core strengthening/stabilization and LE strengthening and progression supine, sitting to standing    Consulted and Agree with  Plan of Care  Patient       Patient will benefit from skilled therapeutic intervention in order to improve the following deficits and impairments:  Pain, Abnormal gait, Decreased strength, Decreased activity tolerance  Visit Diagnosis: Low back pain, unspecified back pain laterality, unspecified chronicity, with sciatica presence unspecified  Muscle weakness (generalized)     Problem List Patient Active Problem List   Diagnosis Date Noted  . Lumbar spinal stenosis 01/09/2017  . Spinal stenosis at L4-L5 level 01/09/2017  . Varicose veins of lower extremities with other complications 17/79/3903  . Chronic venous insufficiency 12/15/2010    DUNFORD, CHRISTINA P, PTA 02/28/2017, 9:04 AM  [facility] [place], [place], [address] Phone: [phone]   Fax:  [phone]  Name: Peter [name] MRN: [id number] Date of Birth: [birth year]

## 2017-03-05 ENCOUNTER — Encounter: Payer: Medicare Other | Admitting: *Deleted

## 2017-03-07 ENCOUNTER — Ambulatory Visit: Payer: BLUE CROSS/BLUE SHIELD | Admitting: Physical Therapy

## 2017-03-07 ENCOUNTER — Encounter: Payer: Self-pay | Admitting: Physical Therapy

## 2017-03-07 DIAGNOSIS — M545 Low back pain: Secondary | ICD-10-CM

## 2017-03-07 DIAGNOSIS — M6281 Muscle weakness (generalized): Secondary | ICD-10-CM

## 2017-03-07 NOTE — Therapy (Signed)
[facility] [place], [place], [address] Phone: [phone]   Fax:  [phone]  Physical Therapy Treatment  Patient Details  Name: Peter [name] MRN: [id number] Date of Birth: [birth year] Referring Provider: Susa Day   Encounter Date: 03/07/2017  PT End of Session - 03/07/17 0805    Visit Number  3    Number of Visits  12    Date for PT Re-Evaluation  03/18/17    Authorization Type  FOTO every 5th visit    PT Start Time  0737    PT Stop Time  0817    PT Time Calculation (min)  40 min    Activity Tolerance  Patient tolerated treatment well    Behavior During Therapy  [facility] for tasks assessed/performed       Past Medical History:  Diagnosis Date  . AAA (abdominal aortic aneurysm) ([place])   . AAA (abdominal aortic aneurysm) ([place])    has graft (Dr. at [facility])  . Anxiety   . Arthritis   . Best vitelliform macular dystrophy    folowed at [name]  . Diabetes mellitus without complication ([place])    type 2  . Foot pain   . GERD (gastroesophageal reflux disease)   . History of kidney stones   . Macular degeneration   . Palpitations   . Varicose veins     Past Surgical History:  Procedure Laterality Date  . ABDOMINAL AORTIC ANEURYSM REPAIR  2008  . ENDOVENOUS ABLATION SAPHENOUS VEIN W/ LASER  05-24-2011   left greater saphenous vein and stab phlebectomy left leg >20 incisions  . ENDOVENOUS ABLATION SAPHENOUS VEIN W/ LASER  06-07-2011   left small saphenous Curt Jews MD  . LUMBAR LAMINECTOMY/DECOMPRESSION MICRODISCECTOMY N/A 01/09/2017   Procedure: Microlumbar decompression L4-5;  Surgeon: Susa Day, MD;  [facility];  Service: Orthopedics;  Laterality: N/A;  . mastoid reconstruction     right 80 Dr. Rock Nephew  . ROTATOR CUFF REPAIR    . TYMPANOPLASTY     bil    There were no vitals filed for this visit.  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  1 hour    How long can you stand comfortably?  Less than 5 minutes    How long can you walk comfortably?  45 minutes    Patient Stated Goals  get back to moving like before    Currently in Pain?  Yes    Pain Score  5     Pain Location  Back    Pain Orientation  Lower    Pain Descriptors / Indicators  Aching;Discomfort    Pain Onset  More than a month ago    Pain Frequency  Intermittent    Aggravating Factors   prolong sitting and increased activity    Pain Relieving Factors  meds and rest                      [facility] Adult PT Treatment/Exercise - 03/07/17 0001      Lumbar Exercises: Aerobic   Nustep  x28min L5 LE only      Lumbar Exercises: Standing   Scapular Retraction  Strengthening;Both;20 reps;Theraband;Limitations    Scapular Retraction Limitations  pink  XTS    Shoulder Extension  Strengthening;Both;20 reps;Theraband;Limitations    Shoulder Extension Limitations  pink XTS      Lumbar Exercises: Seated   Other Seated Lumbar Exercises  2# ball reach and diagnols 2x10      Lumbar Exercises: Supine   Bent Knee Raise  3 seconds;20 reps    Bridge with Ball Squeeze  10 reps;1 second    Straight Leg Raise  20 reps;3 seconds      Knee/Hip Exercises: Supine   Other Supine Knee/Hip Exercises  clam with red t-band x30               PT Short Term Goals - 02/18/17 1226      PT SHORT TERM GOAL #1   Title  STG=LTG        PT Long Term Goals - 03/07/17 9024      PT LONG TERM GOAL #1   Title  Patient will be independent with HEP    Time  4    Period  Weeks    Status  On-going      PT LONG TERM GOAL #2   Title  Patient will improve bilateral hip flexion strength to 5/5 to improve functional activities    Time  4    Period  Weeks    Status  On-going      PT LONG TERM GOAL #3    Title  Patient will ascend/descend  steps with reciprocating gait and one railing with <2/10 low back pain in order to enter/exit home.    Time  4    Period  Weeks    Status  On-going      PT LONG TERM GOAL #4   Title  Patient will increase bilateral knee flexion strength to 5/5 to improve stability for functional activities.    Time  4    Period  Weeks    Status  On-going      PT LONG TERM GOAL #5   Title  Patient will decrease at worst pain to less than or equal to 2/10 pain while performing functional activities.    Time  4    Period  Weeks    Status  On-going            Plan - 03/07/17 0809    Clinical Impression Statement  Patient tolerated treatment well today. Patient able to progress with core activation and strengthening today. Patient has reported doing some HEP activities yet has had a busy week and unable to do as much core exercises as he should have. Patient able to complete exercises with good technique and understanding of activation. Goals progressing at this time.     Rehab Potential  Good    Clinical Impairments Affecting Rehab Potential  lumbar decompression 01/09/17, No bending, lifting or twisting.    PT Frequency  3x / week    PT Duration  4 weeks    PT Treatment/Interventions  ADLs/Self Care Home Management;Electrical Stimulation;Iontophoresis 4mg /ml Dexamethasone;Moist Heat;Therapeutic exercise;Therapeutic activities;Functional mobility training;Stair training;Gait training;Balance training;Neuromuscular re-education;Patient/family education;Manual techniques;Scar mobilization;Passive range of motion;Dry needling    PT Next Visit Plan  cont with POC for Nustep LE only, core strengthening/stabilization and LE strengthening and progression supine, sitting to standing    Consulted and Agree with Plan of Care  Patient       Patient will benefit from skilled therapeutic intervention in order to improve the following deficits and impairments:  Pain, Abnormal  gait, Decreased strength, Decreased activity  tolerance  Visit Diagnosis: Low back pain, unspecified back pain laterality, unspecified chronicity, with sciatica presence unspecified  Muscle weakness (generalized)     Problem List Patient Active Problem List   Diagnosis Date Noted  . Lumbar spinal stenosis 01/09/2017  . Spinal stenosis at L4-L5 level 01/09/2017  . Varicose veins of lower extremities with other complications 95/58/3167  . Chronic venous insufficiency 12/15/2010    Peter [name], PTA 03/07/2017, 8:19 AM  [facility] [place], [place], [address] Phone: [phone]   Fax:  [phone]  Name: Peter [name] MRN: [id number] Date of Birth: 01/02/1951

## 2017-03-12 ENCOUNTER — Ambulatory Visit: Payer: BLUE CROSS/BLUE SHIELD | Admitting: *Deleted

## 2017-03-12 DIAGNOSIS — M545 Low back pain: Secondary | ICD-10-CM

## 2017-03-12 DIAGNOSIS — M6281 Muscle weakness (generalized): Secondary | ICD-10-CM

## 2017-03-12 NOTE — Therapy (Signed)
[facility] [place], [place], [address] Phone: [phone]   Fax:  [phone]  Physical Therapy Treatment  Patient Details  Name: Peter [name] MRN: [id number] Date of Birth: 03-04-1950 Referring Provider: Susa Day   Encounter Date: 03/12/2017  PT End of Session - 03/12/17 0955    Visit Number  4    Number of Visits  12    Date for PT Re-Evaluation  03/18/17    Authorization Type  FOTO every 5th visit    PT Start Time  0900    PT Stop Time  0950    PT Time Calculation (min)  50 min       Past Medical History:  Diagnosis Date  . AAA (abdominal aortic aneurysm) ([place])   . AAA (abdominal aortic aneurysm) ([place])    has graft (Dr. at [facility])  . Anxiety   . Arthritis   . Best vitelliform macular dystrophy    folowed at [facility]  . Diabetes mellitus without complication ([place])    type 2  . Foot pain   . GERD (gastroesophageal reflux disease)   . History of kidney stones   . Macular degeneration   . Palpitations   . Varicose veins     Past Surgical History:  Procedure Laterality Date  . ABDOMINAL AORTIC ANEURYSM REPAIR  2008  . ENDOVENOUS ABLATION SAPHENOUS VEIN W/ LASER  05-24-2011   left greater saphenous vein and stab phlebectomy left leg >20 incisions  . ENDOVENOUS ABLATION SAPHENOUS VEIN W/ LASER  06-07-2011   left small saphenous Curt Jews MD  . LUMBAR LAMINECTOMY/DECOMPRESSION MICRODISCECTOMY N/A 01/09/2017   Procedure: Microlumbar decompression L4-5;  Surgeon: Susa Day, MD;  Location: [facility];  Service: Orthopedics;  Laterality: N/A;  . mastoid reconstruction     right 61 Dr. Rock Nephew  . ROTATOR CUFF REPAIR    . TYMPANOPLASTY     bil    There were no vitals filed for this visit.  Subjective Assessment - 03/12/17 0946    Subjective  Pt arived doing fairly well 4-5/10 LBP    Limitations  House hold activities;Lifting;Standing    How long can you sit comfortably?  1 hour    How long can  you stand comfortably?  Less than 5 minutes    How long can you walk comfortably?  45 minutes    Patient Stated Goals  get back to moving like before    Pain Score  5     Pain Location  Back    Pain Onset  More than a month ago                      [facility] Adult PT Treatment/Exercise - 03/12/17 0001      Exercises   Exercises  Lumbar;Knee/Hip      Lumbar Exercises: Aerobic   Nustep  x27min L5 LE only      Lumbar Exercises: Standing   Scapular Retraction  Strengthening;Both;20 reps;Theraband;Limitations    Scapular Retraction Limitations  pink XTS    Shoulder Extension  Strengthening;Both;20 reps;Theraband;Limitations XTS pink    Other Standing Lumbar Exercises  Lat pulldowns Pink XTS 3x10      Lumbar Exercises: Seated   Other Seated Lumbar Exercises  2# ball reach and diagnols 2x10      Lumbar Exercises: Supine   Ab Set  20 reps;3 seconds    AB Set Limitations  Focus on drawin and normal breathing. V/C notto hold his  breath    Bent Knee Raise  3 seconds;20 reps with drawin    Straight Leg Raise  20 reps;3 seconds               PT Short Term Goals - 02/18/17 1226      PT SHORT TERM GOAL #1   Title  STG=LTG        PT Long Term Goals - 03/07/17 1103      PT LONG TERM GOAL #1   Title  Patient will be independent with HEP    Time  4    Period  Weeks    Status  On-going      PT LONG TERM GOAL #2   Title  Patient will improve bilateral hip flexion strength to 5/5 to improve functional activities    Time  4    Period  Weeks    Status  On-going      PT LONG TERM GOAL #3   Title  Patient will ascend/descend  steps with reciprocating gait and one railing with <2/10 low back pain in order to enter/exit home.    Time  4    Period  Weeks    Status  On-going      PT LONG TERM GOAL #4   Title  Patient will increase bilateral knee flexion strength to 5/5 to improve stability for functional activities.    Time  4    Period  Weeks    Status  On-going       PT LONG TERM GOAL #5   Title  Patient will decrease at worst pain to less than or equal to 2/10 pain while performing functional activities.    Time  4    Period  Weeks    Status  On-going            Plan - 03/12/17 0950    Clinical Impression Statement  Pt arrived today doing fairly well with 4-5/10. Core activation exs were reviewed and pt needed verbal and tactile cues for technique. Pt continues to hold his breath if not cued.LTGs are ongoing at this time    Clinical Presentation  Stable    Clinical Decision Making  Low    Rehab Potential  Good    Clinical Impairments Affecting Rehab Potential  lumbar decompression 01/09/17, No bending, lifting or twisting.    PT Frequency  3x / week    PT Duration  4 weeks    PT Treatment/Interventions  ADLs/Self Care Home Management;Electrical Stimulation;Iontophoresis 4mg /ml Dexamethasone;Moist Heat;Therapeutic exercise;Therapeutic activities;Functional mobility training;Stair training;Gait training;Balance training;Neuromuscular re-education;Patient/family education;Manual techniques;Scar mobilization;Passive range of motion;Dry needling    PT Next Visit Plan  cont with POC for Nustep LE only, core strengthening/stabilization and LE strengthening and progression supine, sitting to standing    PT Home Exercise Plan  Draw ins    Consulted and Agree with Plan of Care  Patient       Patient will benefit from skilled therapeutic intervention in order to improve the following deficits and impairments:  Pain, Abnormal gait, Decreased strength, Decreased activity tolerance  Visit Diagnosis: Low back pain, unspecified back pain laterality, unspecified chronicity, with sciatica presence unspecified  Muscle weakness (generalized)     Problem List Patient Active Problem List   Diagnosis Date Noted  . Lumbar spinal stenosis 01/09/2017  . Spinal stenosis at L4-L5 level 01/09/2017  . Varicose veins of lower extremities with other  complications 15/94/5859  . Chronic venous insufficiency 12/15/2010    RAMSEUR,CHRIS ,  PTA 03/12/2017, 9:56 AM  [facility] [place], [place], [address] Phone: [phone]   Fax:  [phone]  Name: Peter [name] MRN: [id number] Date of Birth: 08-14-1950

## 2017-03-19 ENCOUNTER — Encounter: Payer: Self-pay | Admitting: *Deleted

## 2017-03-19 ENCOUNTER — Ambulatory Visit: Payer: BLUE CROSS/BLUE SHIELD | Attending: Specialist | Admitting: *Deleted

## 2017-03-19 DIAGNOSIS — M6281 Muscle weakness (generalized): Secondary | ICD-10-CM | POA: Diagnosis present

## 2017-03-19 DIAGNOSIS — M545 Low back pain: Secondary | ICD-10-CM | POA: Insufficient documentation

## 2017-03-19 NOTE — Therapy (Signed)
[facility] [place], [place], [address] Phone: [phone]   Fax:  [phone]  Physical Therapy Treatment  Patient Details  Name: Peter [name] MRN: [id number] Date of Birth: Feb 17, 1950 Referring Provider: Susa Day   Encounter Date: 03/19/2017  PT End of Session - 03/19/17 0915    Visit Number  5    Number of Visits  12    Date for PT Re-Evaluation  03/18/17    Authorization Type  FOTO every 5th visit    PT Start Time  0900    PT Stop Time  0947    PT Time Calculation (min)  47 min       Past Medical History:  Diagnosis Date  . AAA (abdominal aortic aneurysm) ([place])   . AAA (abdominal aortic aneurysm) ([place])    has graft (Dr. at [facility])  . Anxiety   . Arthritis   . Best vitelliform macular dystrophy    folowed at [facility]  . Diabetes mellitus without complication ([place])    type 2  . Foot pain   . GERD (gastroesophageal reflux disease)   . History of kidney stones   . Macular degeneration   . Palpitations   . Varicose veins     Past Surgical History:  Procedure Laterality Date  . ABDOMINAL AORTIC ANEURYSM REPAIR  2008  . ENDOVENOUS ABLATION SAPHENOUS VEIN W/ LASER  05-24-2011   left greater saphenous vein and stab phlebectomy left leg >20 incisions  . ENDOVENOUS ABLATION SAPHENOUS VEIN W/ LASER  06-07-2011   left small saphenous Curt Jews MD  . LUMBAR LAMINECTOMY/DECOMPRESSION MICRODISCECTOMY N/A 01/09/2017   Procedure: Microlumbar decompression L4-5;  Surgeon: Susa Day, MD;  Location: [facility];  Service: Orthopedics;  Laterality: N/A;  . mastoid reconstruction     right 48 Dr. Rock Nephew  . ROTATOR CUFF REPAIR    . TYMPANOPLASTY     bil    There were no vitals filed for this visit.  Subjective Assessment - 03/19/17 0916    Subjective  Pt arived doing fairly well 4-5/10 LBP. Increased LBP after performing ADLs around the house    Limitations  House hold activities;Lifting;Standing    How  long can you sit comfortably?  1 hour    How long can you stand comfortably?  Less than 5 minutes    How long can you walk comfortably?  45 minutes    Patient Stated Goals  get back to moving like before    Currently in Pain?  Yes    Pain Score  4     Pain Location  Back    Pain Orientation  Lower    Pain Descriptors / Indicators  Aching;Discomfort    Pain Onset  More than a month ago    Pain Frequency  Intermittent                      [facility] Adult PT Treatment/Exercise - 03/19/17 0001      Exercises   Exercises  Lumbar;Knee/Hip      Lumbar Exercises: Aerobic   Nustep  x54min L5 LE only      Lumbar Exercises: Standing   Scapular Retraction  Strengthening;Both;20 reps;Theraband;Limitations    Scapular Retraction Limitations  pink XTS    Shoulder Extension  Strengthening;Both;20 reps;Theraband;Limitations    Other Standing Lumbar Exercises  Lat pulldowns Pink XTS 5x10      Lumbar Exercises: Seated   Other Seated Lumbar Exercises  2# ball  reach outs and diagnols 2x10 each      Lumbar Exercises: Supine   Ab Set  20 reps;3 seconds    AB Set Limitations  Focus on drawin and normal breathing. V/C notto hold his breath    Bent Knee Raise  3 seconds;20 reps with drawin    Bridge with Diona Foley Squeeze  10 reps;1 second    Straight Leg Raise  20 reps;3 seconds               PT Short Term Goals - 02/18/17 1226      PT SHORT TERM GOAL #1   Title  STG=LTG        PT Long Term Goals - 03/07/17 6578      PT LONG TERM GOAL #1   Title  Patient will be independent with HEP    Time  4    Period  Weeks    Status  On-going      PT LONG TERM GOAL #2   Title  Patient will improve bilateral hip flexion strength to 5/5 to improve functional activities    Time  4    Period  Weeks    Status  On-going      PT LONG TERM GOAL #3   Title  Patient will ascend/descend  steps with reciprocating gait and one railing with <2/10 low back pain in order to enter/exit home.     Time  4    Period  Weeks    Status  On-going      PT LONG TERM GOAL #4   Title  Patient will increase bilateral knee flexion strength to 5/5 to improve stability for functional activities.    Time  4    Period  Weeks    Status  On-going      PT LONG TERM GOAL #5   Title  Patient will decrease at worst pain to less than or equal to 2/10 pain while performing functional activities.    Time  4    Period  Weeks    Status  On-going            Plan - 03/19/17 0917    Clinical Impression Statement  Pt arrived today doing fairly well , but had increased LBP from ADLs performed yesterday. He did well with standing and supine  core exs, but still needed cues for technique. He was asked to relax more while trying to drawin and only try with about 50% effort due to inhaling and exhaling with to much emphasis.. Pt did well after relaxing more and technique improved.    Clinical Decision Making  Low    Clinical Impairments Affecting Rehab Potential  lumbar decompression 01/09/17, No bending, lifting or twisting.    PT Frequency  3x / week    PT Duration  4 weeks    PT Next Visit Plan  cont with POC for Nustep LE only, core strengthening/stabilization and LE strengthening and progression supine, sitting to standing    PT Home Exercise Plan  Draw ins    Consulted and Agree with Plan of Care  Patient       Patient will benefit from skilled therapeutic intervention in order to improve the following deficits and impairments:  Pain, Abnormal gait, Decreased strength, Decreased activity tolerance  Visit Diagnosis: Low back pain, unspecified back pain laterality, unspecified chronicity, with sciatica presence unspecified  Muscle weakness (generalized)     Problem List Patient Active Problem List   Diagnosis Date  Noted  . Lumbar spinal stenosis 01/09/2017  . Spinal stenosis at L4-L5 level 01/09/2017  . Varicose veins of lower extremities with other complications 09/31/1216  . Chronic  venous insufficiency 12/15/2010    RAMSEUR,CHRIS, PTA 03/19/2017, 10:40 AM  [facility] [place], [place], [address] Phone: [phone]   Fax:  [phone]  Name: Peter [name] MRN: [id number] Date of Birth: 16-Jun-1950

## 2017-03-26 ENCOUNTER — Ambulatory Visit: Payer: BLUE CROSS/BLUE SHIELD | Admitting: Physical Therapy

## 2017-03-26 DIAGNOSIS — M545 Low back pain: Secondary | ICD-10-CM | POA: Diagnosis not present

## 2017-03-26 DIAGNOSIS — M6281 Muscle weakness (generalized): Secondary | ICD-10-CM

## 2017-03-26 NOTE — Therapy (Addendum)
[facility] [place], [place], [address] Phone: [phone]   Fax:  [phone]  Physical Therapy Treatment  Patient Details  Name: Peter [name] MRN: [id number] Date of Birth: [birth year] Referring Provider: Susa Day   Encounter Date: 03/26/2017  PT End of Session - 03/26/17 0735    Visit Number  6    Number of Visits  12    Date for PT Re-Evaluation  03/18/17    Authorization Type  FOTO every 5th visit    PT Start Time  0731    PT Stop Time  0815    PT Time Calculation (min)  44 min    Activity Tolerance  Patient tolerated treatment well    Behavior During Therapy  [facility] for tasks assessed/performed       Past Medical History:  Diagnosis Date  . AAA (abdominal aortic aneurysm) ([place])   . AAA (abdominal aortic aneurysm) ([place])    has graft (Dr. at [facility])  . Anxiety   . Arthritis   . Best vitelliform macular dystrophy    folowed at [facility]  . Diabetes mellitus without complication ([place])    type 2  . Foot pain   . GERD (gastroesophageal reflux disease)   . History of kidney stones   . Macular degeneration   . Palpitations   . Varicose veins     Past Surgical History:  Procedure Laterality Date  . ABDOMINAL AORTIC ANEURYSM REPAIR  2008  . ENDOVENOUS ABLATION SAPHENOUS VEIN W/ LASER  05-24-2011   left greater saphenous vein and stab phlebectomy left leg >20 incisions  . ENDOVENOUS ABLATION SAPHENOUS VEIN W/ LASER  06-07-2011   left small saphenous Curt Jews MD  . LUMBAR LAMINECTOMY/DECOMPRESSION MICRODISCECTOMY N/A 01/09/2017   Procedure: Microlumbar decompression L4-5;  Surgeon: Susa Day, MD;  Location: [facility];  Service: Orthopedics;  Laterality: N/A;  . mastoid reconstruction     right 9 Dr. Rock Nephew  . ROTATOR CUFF REPAIR    . TYMPANOPLASTY     bil    There were no vitals filed for this visit.  Subjective Assessment - 03/26/17 0740    Subjective  Patient stated PT has overall been  helping although he felt he did "a little too much" last visit.    Limitations  House hold activities;Lifting;Standing    How long can you sit comfortably?  1 hour    How long can you stand comfortably?  Less than 5 minutes    How long can you walk comfortably?  45 minutes    Patient Stated Goals  get back to moving like before    Currently in Pain?  Yes    Pain Score  4     Pain Orientation  Lower    Pain Descriptors / Indicators  Aching    Pain Type  Surgical pain    Pain Onset  More than a month ago    Pain Frequency  Intermittent         [facility] PT Assessment - 03/26/17 0001      Assessment   Medical Diagnosis  Lumbar decompression    Onset Date/Surgical Date  01/09/17    Next MD Visit  April 2019      Precautions   Precautions  Back    Precaution Comments  No bending lifting and twisting                  [facility] Adult PT Treatment/Exercise - 03/26/17 0001  Lumbar Exercises: Aerobic   Nustep  x37mn L5 LE only      Lumbar Exercises: Standing   Scapular Retraction  Strengthening;Both;20 reps;Theraband;Limitations    Scapular Retraction Limitations  pink XTS    Shoulder Extension  Strengthening;Both;20 reps;Theraband;Limitations Pink XTS      Lumbar Exercises: Seated   Other Seated Lumbar Exercises  4# ball reach outs and diagnols 2x10 each      Lumbar Exercises: Supine   Ab Set  20 reps;3 seconds    AB Set Limitations  Focus on drawin and normal breathing. V/C notto hold his breath    Straight Leg Raise  20 reps;3 seconds with ER    Other Supine Lumbar Exercises  supine clam shells yellow 2x10      Knee/Hip Exercises: Standing   Forward Step Up  --    Rocker Board  2 minutes AP with draw in               PT Short Term Goals - 02/18/17 1226      PT SHORT TERM GOAL #1   Title  STG=LTG        PT Long Term Goals - 03/07/17 03254     PT LONG TERM GOAL #1   Title  Patient will be independent with HEP    Time  4    Period  Weeks     Status  On-going      PT LONG TERM GOAL #2   Title  Patient will improve bilateral hip flexion strength to 5/5 to improve functional activities    Time  4    Period  Weeks    Status  On-going      PT LONG TERM GOAL #3   Title  Patient will ascend/descend  steps with reciprocating gait and one railing with <2/10 low back pain in order to enter/exit home.    Time  4    Period  Weeks    Status  On-going      PT LONG TERM GOAL #4   Title  Patient will increase bilateral knee flexion strength to 5/5 to improve stability for functional activities.    Time  4    Period  Weeks    Status  On-going      PT LONG TERM GOAL #5   Title  Patient will decrease at worst pain to less than or equal to 2/10 pain while performing functional activities.    Time  4    Period  Weeks    Status  On-going            Plan - 03/26/17 0907    Clinical Impression Statement  Patient was able to complete exercises with no increase of back pain or hip pain. Patient noted with increased ankle pain secondary to previous surgery during rockerboard exercise. Patient was able to demonstrate proper technique after verbal and tactile cuing. Patient continues to draw in with too much emphasis but was able to complete with proper form with cuing.  FOTO limitation: 61% however many questions asked go against lumbar precautions    Clinical Presentation  Stable    Clinical Decision Making  Low    Rehab Potential  Good    Clinical Impairments Affecting Rehab Potential  lumbar decompression 01/09/17, No bending, lifting or twisting.    PT Frequency  3x / week    PT Duration  4 weeks    PT Treatment/Interventions  ADLs/Self Care Home Management;Electrical Stimulation;Iontophoresis 499mml Dexamethasone;Moist  Heat;Therapeutic exercise;Therapeutic activities;Functional mobility training;Stair training;Gait training;Balance training;Neuromuscular re-education;Patient/family education;Manual techniques;Scar mobilization;Passive  range of motion;Dry needling    PT Next Visit Plan  cont with POC for Nustep LE only, core strengthening/stabilization and LE strengthening and progression supine, sitting to standing    PT Home Exercise Plan  Draw ins    Consulted and Agree with Plan of Care  Patient       Patient will benefit from skilled therapeutic intervention in order to improve the following deficits and impairments:  Pain, Abnormal gait, Decreased strength, Decreased activity tolerance  Visit Diagnosis: Low back pain, unspecified back pain laterality, unspecified chronicity, with sciatica presence unspecified  Muscle weakness (generalized)     Problem List Patient Active Problem List   Diagnosis Date Noted  . Lumbar spinal stenosis 01/09/2017  . Spinal stenosis at L4-L5 level 01/09/2017  . Varicose veins of lower extremities with other complications 41/58/3094  . Chronic venous insufficiency 12/15/2010   PHYSICAL THERAPY DISCHARGE SUMMARY  Visits from Start of Care: 6  Current functional level related to goals / functional outcomes: See above   Remaining deficits: See goals   Education / Equipment: HEP   Plan: Patient agrees to discharge.  Patient goals were not met. Patient is being discharged due to not returning since the last visit.  ?????       Gabriela Eves, PT, DPT 03/26/2017, 9:53 AM  [facility] [address] [place], [place], [address] Phone: [phone]   Fax:  [phone]  Name: Peter [name] MRN: [id number] Date of Birth: Aug 26, 1950

## 2017-04-02 ENCOUNTER — Encounter: Payer: Medicare Other | Admitting: *Deleted

## 2018-09-02 ENCOUNTER — Other Ambulatory Visit: Payer: Self-pay

## 2018-09-02 ENCOUNTER — Ambulatory Visit: Payer: Medicare HMO | Attending: Specialist | Admitting: Physical Therapy

## 2018-09-02 DIAGNOSIS — M545 Low back pain, unspecified: Secondary | ICD-10-CM

## 2018-09-02 DIAGNOSIS — G8929 Other chronic pain: Secondary | ICD-10-CM | POA: Diagnosis present

## 2018-09-02 DIAGNOSIS — M6281 Muscle weakness (generalized): Secondary | ICD-10-CM | POA: Insufficient documentation

## 2018-09-02 NOTE — Therapy (Addendum)
[facility] [place], [place], [address] Phone: [phone]   Fax:  [phone]  Physical Therapy Evaluation  Patient Details  Name: Peter [name] MRN: [id number] Date of Birth: 06-29-1950 Referring Provider (PT): Susa Day MD   Encounter Date: 09/02/2018  PT End of Session - 09/02/18 1158    Visit Number  1    Number of Visits  12    Date for PT Re-Evaluation  09/30/18    Authorization Type  FOTO AT LEAST EVERY 5TH VISIT.  PROGRESS NOTE AT 10TH VISIT.  KX MODIFIER AFTER 15 VISITS.    PT Start Time  0902    PT Stop Time  0952    PT Time Calculation (min)  50 min    Activity Tolerance  Patient tolerated treatment well    Behavior During Therapy  [facility] for tasks assessed/performed       Past Medical History:  Diagnosis Date  . AAA (abdominal aortic aneurysm) ([place])   . AAA (abdominal aortic aneurysm) ([place])    has graft (Dr. at [facility])  . Anxiety   . Arthritis   . Best vitelliform macular dystrophy    folowed at [facility]  . Diabetes mellitus without complication ([place])    type 2  . Foot pain   . GERD (gastroesophageal reflux disease)   . History of kidney stones   . Macular degeneration   . Palpitations   . Varicose veins     Past Surgical History:  Procedure Laterality Date  . ABDOMINAL AORTIC ANEURYSM REPAIR  2008  . ENDOVENOUS ABLATION SAPHENOUS VEIN W/ LASER  05-24-2011   left greater saphenous vein and stab phlebectomy left leg >20 incisions  . ENDOVENOUS ABLATION SAPHENOUS VEIN W/ LASER  06-07-2011   left small saphenous Curt Jews MD  . LUMBAR LAMINECTOMY/DECOMPRESSION MICRODISCECTOMY N/A 01/09/2017   Procedure: Microlumbar decompression L4-5;  Surgeon: Susa Day, MD;  Location: [facility];  Service: Orthopedics;  Laterality: N/A;  . mastoid reconstruction     right 86 Dr. Rock Nephew  . ROTATOR CUFF REPAIR    . TYMPANOPLASTY     bil    There were no vitals filed for this visit.   Subjective  Assessment - 09/02/18 1200    Subjective  COVID-19 screen performed prior to patient entering clinic.  The patient reports ongoing low back pain with pain into both hips over the last 6 months+.  His pain is rated at a 4/10 today but can rise to higher levels with prolonged sitting.  Medication decreaseshis pain.    Pertinent History  RTC repair, DM, previous lumbar surgery.    How long can you sit comfortably?  15 minutes.    Patient Stated Goals  Reduce pain.    Currently in Pain?  Yes    Pain Score  4     Pain Location  Back    Pain Orientation  Right;Left;Lower    Pain Descriptors / Indicators  Aching    Pain Type  Chronic pain    Pain Onset  More than a month ago    Pain Frequency  Constant    Aggravating Factors   See above.    Pain Relieving Factors  See above.         [facility] PT Assessment - 09/02/18 0001      Assessment   Medical Diagnosis  Degeneration of lumbar intervertebral disc.    Referring Provider (PT)  Susa Day MD    Onset Date/Surgical Date  --  6 months+     Precautions   Precautions  None      Restrictions   Weight Bearing Restrictions  No      Balance Screen   Has the patient fallen in the past 6 months  No    Has the patient had a decrease in activity level because of a fear of falling?   No    Is the patient reluctant to leave their home because of a fear of falling?   No      Home Environment   Living Environment  Private residence      Prior Function   Level of Independence  Independent      Observation/Other Assessments   Focus on Therapeutic Outcomes (FOTO)   60% limitation.      Posture/Postural Control   Posture/Postural Control  Postural limitations    Postural Limitations  Rounded Shoulders;Forward head;Decreased lumbar lordosis;Increased thoracic kyphosis    Posture Comments  Left tibial ER.      Deep Tendon Reflexes   DTR Assessment Site  Patella;Achilles    Patella DTR  1+    Achilles DTR  1+      ROM / Strength   AROM /  PROM / Strength  AROM;Strength      AROM   Overall AROM Comments  Lumbar flexion decreased by 50% and extension= 15 degrees.      Strength   Overall Strength Comments  Normal bilateral LE strength.      Palpation   Palpation comment  Patient c/o bilateral lower lumbar pain.      Special Tests   Other special tests  (=) leg lengths, (-) SLR and FABER testing.      Transfers   Comments  Instruct in log roll technique.      Ambulation/Gait   Gait Comments  WNL.                Objective measurements completed on examination: See above findings.      [facility] Adult PT Treatment/Exercise - 09/02/18 0001      Modalities   Modalities  Electrical Stimulation;Moist Heat      Moist Heat Therapy   Number Minutes Moist Heat  20 Minutes    Moist Heat Location  Lumbar Spine      Electrical Stimulation   Electrical Stimulation Location  Low back.    Electrical Stimulation Action  IFC    Electrical Stimulation Parameters  80-150 Hz x 20 minutes.    Electrical Stimulation Goals  Pain               PT Short Term Goals - 09/02/18 1218      PT SHORT TERM GOAL #1   Title  STG=LTG        PT Long Term Goals - 09/02/18 1218      PT LONG TERM GOAL #1   Title  Patient will be independent with HEP    Period  Weeks    Status  New      PT LONG TERM GOAL #2   Title  Sit 30 minutes with pain not > 3/10.    Period  Weeks    Status  New      PT LONG TERM GOAL #3   Title  No radiation of pain into bilateral hips.    Time  4    Period  Weeks    Status  New  Plan - 09/02/18 1213    Clinical Impression Statement  The patient presents to [facility] wiht c/o bilateral lower back pain with radiation into both hips.  This has been ongoing and woresening over the last 6 months or more.  His LE strength is normal.  He reports tenderness to palpation in his bilateral lower back region. Special testing is negative.  Patient will benefit from skilled physical therapy  intervention to address deficits and pain.    Personal Factors and Comorbidities  Comorbidity 1;Age    Comorbidities  RTC repair, DM, previous lumbar surgery.    Examination-Activity Limitations  Sit    Stability/Clinical Decision Making  Evolving/Moderate complexity    Clinical Decision Making  Low    Rehab Potential  Good    PT Frequency  3x / week    PT Duration  4 weeks    PT Treatment/Interventions  ADLs/Self Care Home Management;Electrical Stimulation;Ultrasound;Moist Heat;Therapeutic activities;Therapeutic exercise;Manual techniques;Patient/family education;Passive range of motion;Dry needling    PT Next Visit Plan  Please begin with HMP/E'stim, combo e'stim/U/S and STW/M, S and DKTC, hip bridges and core exercise progression.    Consulted and Agree with Plan of Care  Patient       Patient will benefit from skilled therapeutic intervention in order to improve the following deficits and impairments:  Pain, Postural dysfunction, Decreased activity tolerance, Decreased range of motion  Visit Diagnosis: 1. Chronic bilateral low back pain without sciatica        Problem List Patient Active Problem List   Diagnosis Date Noted  . Lumbar spinal stenosis 01/09/2017  . Spinal stenosis at L4-L5 level 01/09/2017  . Varicose veins of lower extremities with other complications 25/00/3704  . Chronic venous insufficiency 12/15/2010    , [place] MPT 09/02/2018, 12:24 PM  [facility] [address] [place], [place], [address] Phone: [phone]   Fax:  [phone]  Name: Peter [name] MRN: [id number] Date of Birth: [birth year]

## 2018-09-09 ENCOUNTER — Ambulatory Visit: Payer: Medicare HMO | Admitting: *Deleted

## 2018-09-09 ENCOUNTER — Other Ambulatory Visit: Payer: Self-pay

## 2018-09-09 DIAGNOSIS — M545 Low back pain, unspecified: Secondary | ICD-10-CM

## 2018-09-09 DIAGNOSIS — M6281 Muscle weakness (generalized): Secondary | ICD-10-CM

## 2018-09-09 DIAGNOSIS — G8929 Other chronic pain: Secondary | ICD-10-CM

## 2018-09-09 NOTE — Therapy (Signed)
[facility] [place], [place], [address] Phone: [phone]   Fax:  [phone]  Physical Therapy Treatment  Patient Details  Name: Peter [name] MRN: [id number] Date of Birth: Feb 12, 1950 Referring Provider (PT): Susa Day MD   Encounter Date: 09/09/2018  PT End of Session - 09/09/18 [id number]    Visit Number  2    Number of Visits  12    Date for PT Re-Evaluation  09/30/18    Authorization Type  FOTO AT LEAST EVERY 5TH VISIT.  PROGRESS NOTE AT 10TH VISIT.  KX MODIFIER AFTER 15 VISITS.    PT Start Time  0900    PT Stop Time  0950    PT Time Calculation (min)  50 min       Past Medical History:  Diagnosis Date  . AAA (abdominal aortic aneurysm) ([place])   . AAA (abdominal aortic aneurysm) ([place])    has graft (Dr. at [facility])  . Anxiety   . Arthritis   . Best vitelliform macular dystrophy    folowed at [facility]  . Diabetes mellitus without complication ([place])    type 2  . Foot pain   . GERD (gastroesophageal reflux disease)   . History of kidney stones   . Macular degeneration   . Palpitations   . Varicose veins     Past Surgical History:  Procedure Laterality Date  . ABDOMINAL AORTIC ANEURYSM REPAIR  2008  . ENDOVENOUS ABLATION SAPHENOUS VEIN W/ LASER  05-24-2011   left greater saphenous vein and stab phlebectomy left leg >20 incisions  . ENDOVENOUS ABLATION SAPHENOUS VEIN W/ LASER  06-07-2011   left small saphenous Curt Jews MD  . LUMBAR LAMINECTOMY/DECOMPRESSION MICRODISCECTOMY N/A 01/09/2017   Procedure: Microlumbar decompression L4-5;  Surgeon: Susa Day, MD;  Location: [facility];  Service: Orthopedics;  Laterality: N/A;  . mastoid reconstruction     right 12 Dr. Rock Nephew  . ROTATOR CUFF REPAIR    . TYMPANOPLASTY     bil    There were no vitals filed for this visit.  Subjective Assessment - 09/09/18 0903    Subjective  COVID-19 screen performed prior to patient entering clinic.  4/10 pain today     Pertinent History  RTC repair, DM, previous lumbar surgery.    How long can you sit comfortably?  15 minutes.    Patient Stated Goals  Reduce pain.    Currently in Pain?  Yes    Pain Score  4     Pain Location  Back    Pain Orientation  Right;Left;Lower    Pain Type  Chronic pain    Pain Onset  More than a month ago                       [facility] Adult PT Treatment/Exercise - 09/09/18 0001      Therapeutic Activites    Therapeutic Activities  ADL's    ADL's  Log roll, sleeping with pillows  at knees, power lift position, sit to stand      Exercises   Exercises  Lumbar;Knee/Hip      Lumbar Exercises: Stretches   Single Knee to Chest Stretch  Left;Right;2 reps;30 seconds      Lumbar Exercises: Supine   Ab Set  10 reps;5 seconds    Bent Knee Raise  20 reps;3 seconds    Bridge  20 reps      Modalities   Modalities  Electrical Stimulation;Ultrasound  Moist Heat Therapy   Number Minutes Moist Heat  15 Minutes    Moist Heat Location  Lumbar Spine      Electrical Stimulation   Electrical Stimulation Location  Low back.  IFC x 15 mins 80-150hz     Electrical Stimulation Goals  Pain               PT Short Term Goals - 09/02/18 1218      PT SHORT TERM GOAL #1   Title  STG=LTG        PT Long Term Goals - 09/02/18 1218      PT LONG TERM GOAL #1   Title  Patient will be independent with HEP    Period  Weeks    Status  New      PT LONG TERM GOAL #2   Title  Sit 30 minutes with pain not > 3/10.    Period  Weeks    Status  New      PT LONG TERM GOAL #3   Title  No radiation of pain into bilateral hips.    Time  4    Period  Weeks    Status  New            Plan - 09/09/18 0904    Clinical Impression Statement  Pt arrived today doing fair with LBP 4/10. He was able to perform HEP for core activation  and sheet was given for instructions. Verbal and tactile cues for technique needed. ADLs were reviewed for sleeping postures, supine to  sit, and power position for picking up objects. Normal modality response today.    Comorbidities  RTC repair, DM, previous lumbar surgery.    Examination-Activity Limitations  Sit    Stability/Clinical Decision Making  Evolving/Moderate complexity    Rehab Potential  Good    PT Frequency  3x / week    PT Duration  4 weeks    PT Treatment/Interventions  ADLs/Self Care Home Management;Electrical Stimulation;Ultrasound;Moist Heat;Therapeutic activities;Therapeutic exercise;Manual techniques;Patient/family education;Passive range of motion;Dry needling    PT Next Visit Plan  Please begin with HMP/E'stim, combo e'stim/U/S and STW/M, S and DKTC, hip bridges and core exercise progression.    Consulted and Agree with Plan of Care  Patient       Patient will benefit from skilled therapeutic intervention in order to improve the following deficits and impairments:  Pain, Postural dysfunction, Decreased activity tolerance, Decreased range of motion  Visit Diagnosis: Chronic bilateral low back pain without sciatica  Muscle weakness (generalized)     Problem List Patient Active Problem List   Diagnosis Date Noted  . Lumbar spinal stenosis 01/09/2017  . Spinal stenosis at L4-L5 level 01/09/2017  . Varicose veins of lower extremities with other complications 99991111  . Chronic venous insufficiency 12/15/2010    RAMSEUR,CHRIS, PTA 09/09/2018, 10:29 AM  [facility] [address] [place], [place], [address] Phone: [phone]   Fax:  [phone]  Name: Peter [name] MRN: [id number] Date of Birth: 1950-12-02

## 2018-09-11 IMAGING — DX DG LUMBAR SPINE 2-3V
2 series · 2 of 2 positions shown · non-contrast
Comparison: MRI of the lumbar spine dated November 13, 2016

CLINICAL DATA: Preoperative examination prior to lumbar surgery.

EXAM:
LUMBAR SPINE - 2-3 VIEW

[l-spine ap]
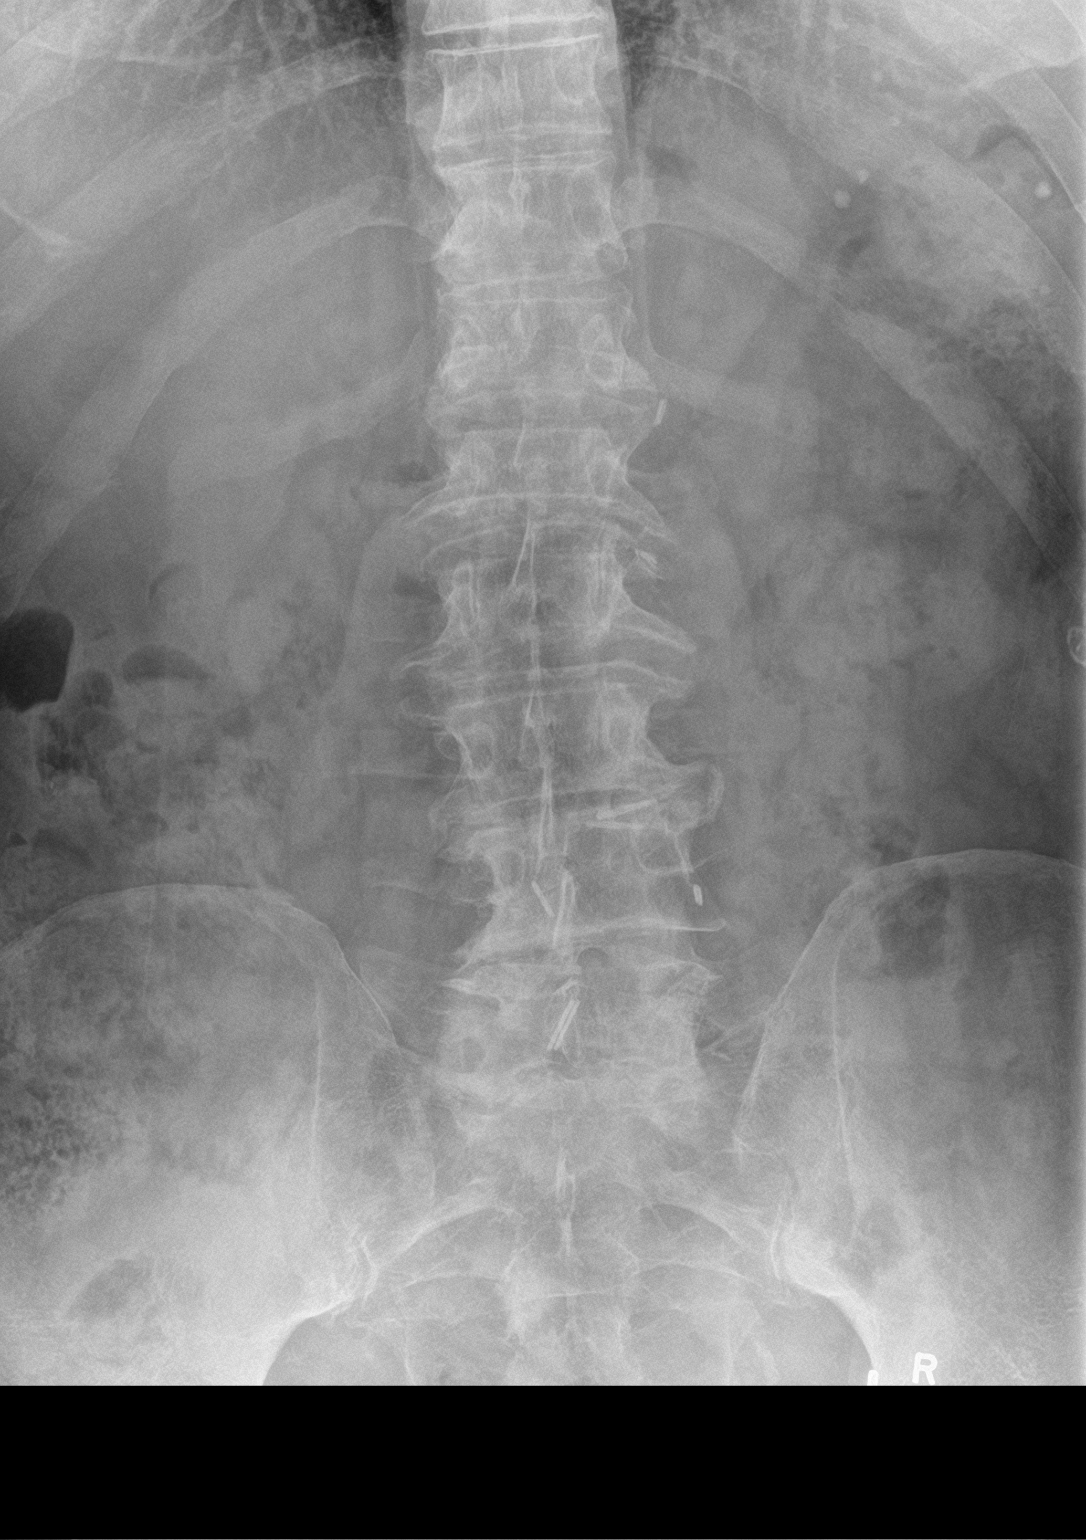

[l-spine lat]
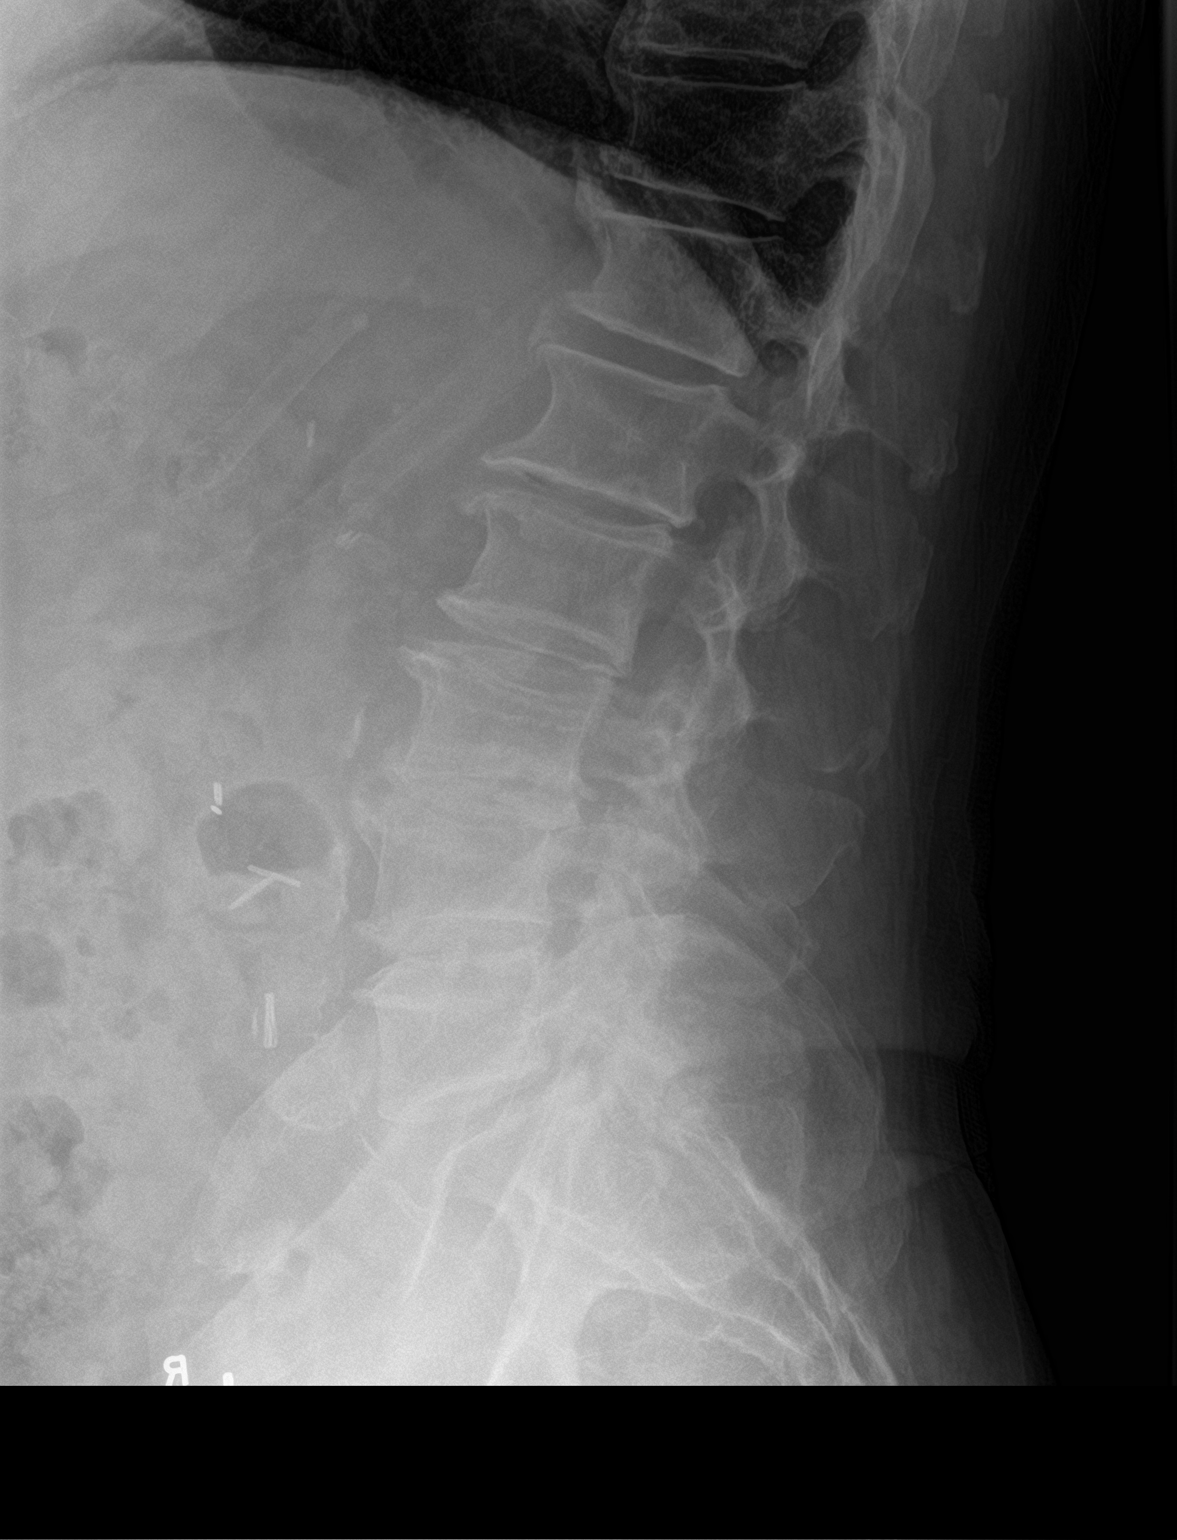

[2 of 2 positions shown; findings below may reference images not displayed]

FINDINGS: Numbering of the vertebral levels is in accordance with that used on
the lumbar spine MRI of November 13, 2016. The lumbar vertebral
bodies are preserved in height. There is moderate disc space
narrowing at L3-4 with milder narrowing at L4-5 and L1-2. There are
anterior endplate osteophytes throughout the lumbar spine. There is
mild facet joint hypertrophy at L4-5 and L5-S1. There is no
spondylolisthesis. The pedicles and transverse processes are intact.
The observed portions of the sacrum are normal.
IMPRESSION: Degenerative disc and facet joint change of the lumbar spine as
described. No compression fracture.

## 2018-09-16 ENCOUNTER — Ambulatory Visit: Payer: Medicare HMO | Attending: Specialist | Admitting: *Deleted

## 2018-09-16 ENCOUNTER — Other Ambulatory Visit: Payer: Self-pay

## 2018-09-16 DIAGNOSIS — M6281 Muscle weakness (generalized): Secondary | ICD-10-CM

## 2018-09-16 DIAGNOSIS — M545 Low back pain, unspecified: Secondary | ICD-10-CM

## 2018-09-16 DIAGNOSIS — G8929 Other chronic pain: Secondary | ICD-10-CM | POA: Diagnosis present

## 2018-09-16 NOTE — Therapy (Signed)
[facility] [place], [place], [address] Phone: [phone]   Fax:  [phone]  Physical Therapy Treatment  Patient Details  Name: Peter [name] MRN: [id number] Date of Birth: December 21, 1950 Referring Provider (PT): Susa Day MD   Encounter Date: 09/16/2018  PT End of Session - 09/16/18 0932    Visit Number  3    Number of Visits  12    Date for PT Re-Evaluation  09/30/18    Authorization Type  FOTO AT LEAST EVERY 5TH VISIT.  PROGRESS NOTE AT 10TH VISIT.  KX MODIFIER AFTER 15 VISITS.    PT Start Time  0900    PT Stop Time  0950    PT Time Calculation (min)  50 min       Past Medical History:  Diagnosis Date  . AAA (abdominal aortic aneurysm) ([place])   . AAA (abdominal aortic aneurysm) ([place])    has graft (Dr. at [facility])  . Anxiety   . Arthritis   . Best vitelliform macular dystrophy    folowed at [facility]  . Diabetes mellitus without complication ([place])    type 2  . Foot pain   . GERD (gastroesophageal reflux disease)   . History of kidney stones   . Macular degeneration   . Palpitations   . Varicose veins     Past Surgical History:  Procedure Laterality Date  . ABDOMINAL AORTIC ANEURYSM REPAIR  2008  . ENDOVENOUS ABLATION SAPHENOUS VEIN W/ LASER  05-24-2011   left greater saphenous vein and stab phlebectomy left leg >20 incisions  . ENDOVENOUS ABLATION SAPHENOUS VEIN W/ LASER  06-07-2011   left small saphenous Curt Jews MD  . LUMBAR LAMINECTOMY/DECOMPRESSION MICRODISCECTOMY N/A 01/09/2017   Procedure: Microlumbar decompression L4-5;  Surgeon: Susa Day, MD;  Location: [facility];  Service: Orthopedics;  Laterality: N/A;  . mastoid reconstruction     right 94 Dr. Rock Nephew  . ROTATOR CUFF REPAIR    . TYMPANOPLASTY     bil    There were no vitals filed for this visit.  Subjective Assessment - 09/16/18 0900    Subjective  COVID-19 screen performed prior to patient entering clinic.  3/10 pain today.  Did good after last Rx    Pertinent History  RTC repair, DM, previous lumbar surgery.    How long can you sit comfortably?  15 minutes.    Patient Stated Goals  Reduce pain.    Currently in Pain?  Yes    Pain Score  3     Pain Location  Back    Pain Orientation  Right;Left;Lower    Pain Descriptors / Indicators  Aching    Pain Type  Chronic pain                       [facility] Adult PT Treatment/Exercise - 09/16/18 0001      Exercises   Exercises  Lumbar;Knee/Hip      Lumbar Exercises: Stretches   Single Knee to Chest Stretch  Left;Right;30 seconds;5 reps      Lumbar Exercises: Supine   Ab Set  10 reps;5 seconds    Bent Knee Raise  20 reps;3 seconds    Bridge  20 reps      Modalities   Modalities  Electrical Stimulation;Ultrasound      Moist Heat Therapy   Number Minutes Moist Heat  15 Minutes    Moist Heat Location  Lumbar Spine  Acupuncturist Location  Low back.  IFC x 15 mins 80-150hz     Electrical Stimulation Goals  Pain      Ultrasound   Ultrasound Location  LB paras RT sidelying    Ultrasound Parameters  1.5 w/cm2 x 12 mins    Ultrasound Goals  Pain               PT Short Term Goals - 09/02/18 1218      PT SHORT TERM GOAL #1   Title  STG=LTG        PT Long Term Goals - 09/02/18 1218      PT LONG TERM GOAL #1   Title  Patient will be independent with HEP    Period  Weeks    Status  New      PT LONG TERM GOAL #2   Title  Sit 30 minutes with pain not > 3/10.    Period  Weeks    Status  New      PT LONG TERM GOAL #3   Title  No radiation of pain into bilateral hips.    Time  4    Period  Weeks    Status  New            Plan - 09/16/18 0902    Clinical Impression Statement  Pt arrived today doing better with decreased pain. HEP was reviewed with cues for technique. Pt did well with [name] combo and modalities with normal response.    Personal Factors and Comorbidities  Comorbidity  1;Age    Comorbidities  RTC repair, DM, previous lumbar surgery.    Stability/Clinical Decision Making  Evolving/Moderate complexity    Rehab Potential  Good    PT Frequency  3x / week    PT Duration  4 weeks    PT Treatment/Interventions  ADLs/Self Care Home Management;Electrical Stimulation;Ultrasound;Moist Heat;Therapeutic activities;Therapeutic exercise;Manual techniques;Patient/family education;Passive range of motion;Dry needling    PT Next Visit Plan  Please begin with HMP/E'stim, combo e'stim/U/S and STW/M, S and DKTC, hip bridges and core exercise progression.    Consulted and Agree with Plan of Care  Patient       Patient will benefit from skilled therapeutic intervention in order to improve the following deficits and impairments:  Pain, Postural dysfunction, Decreased activity tolerance, Decreased range of motion  Visit Diagnosis: Chronic bilateral low back pain without sciatica  Muscle weakness (generalized)     Problem List Patient Active Problem List   Diagnosis Date Noted  . Lumbar spinal stenosis 01/09/2017  . Spinal stenosis at L4-L5 level 01/09/2017  . Varicose veins of lower extremities with other complications 99991111  . Chronic venous insufficiency 12/15/2010    ,CHRIS, PTA 09/16/2018, 9:53 AM  [facility] [address] [place], [place], [address] Phone: [phone]   Fax:  [phone]  Name: Peter [name] MRN: [id number] Date of Birth: Aug 06, 1950

## 2018-09-17 IMAGING — DX DG SPINE 1V PORT
1 series · 1 of 1 positions shown · non-contrast
Comparison: Study obtained earlier in the day

CLINICAL DATA: L4-5 lumbar decompression

EXAM:
PORTABLE SPINE - 1 VIEW

[l-spine lat]
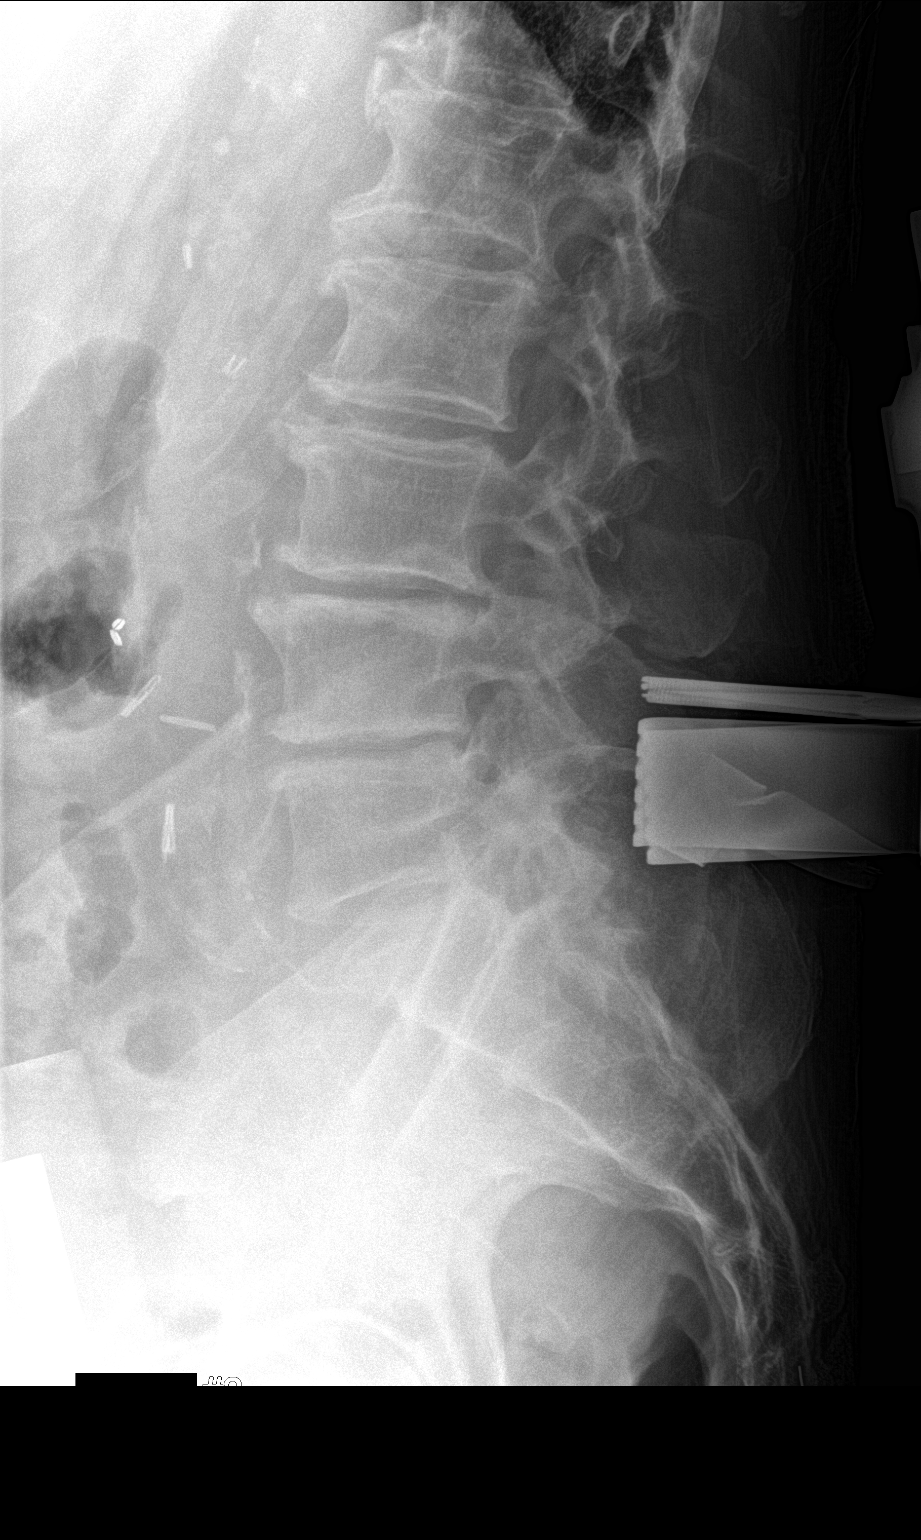

[1 of 1 positions shown; findings below may reference images not displayed]

FINDINGS: Cross-table lateral lumbar image time stamped [DATE] submitted.
Metallic probe overlies the L4 spinous process with the tip directed
at the midportion of the L4 vertebral body. Cutting tool overlies
the L5 lamina, directed posterior to the L5 vertebral body. There is
moderate disc space narrowing at L3-4, L4-5, L5-S1. No fracture or
spondylolisthesis. There is aortoiliac atherosclerosis.
IMPRESSION: Metallic probe tip overlies the L4 spinous process with the tip
directed at the midportion of the L4 vertebral body. Cutting tool
overlies the L5 spinous process. Osteoarthritic change noted at
several levels. No fracture or spondylolisthesis. There is
aortoiliac atherosclerosis.

Aortic Atherosclerosis ([id number]).

## 2018-09-23 ENCOUNTER — Ambulatory Visit: Payer: Medicare HMO | Admitting: *Deleted

## 2018-09-24 ENCOUNTER — Ambulatory Visit: Payer: Medicare HMO | Admitting: Physical Therapy

## 2018-09-24 ENCOUNTER — Encounter: Payer: Self-pay | Admitting: Physical Therapy

## 2018-09-24 ENCOUNTER — Other Ambulatory Visit: Payer: Self-pay

## 2018-09-24 DIAGNOSIS — M6281 Muscle weakness (generalized): Secondary | ICD-10-CM

## 2018-09-24 DIAGNOSIS — M545 Low back pain: Secondary | ICD-10-CM | POA: Diagnosis not present

## 2018-09-24 DIAGNOSIS — G8929 Other chronic pain: Secondary | ICD-10-CM

## 2018-09-24 NOTE — Therapy (Addendum)
[facility] [place], [place], [address] Phone: [phone]   Fax:  [phone]  Physical Therapy Treatment PHYSICAL THERAPY DISCHARGE SUMMARY  Visits from Start of Care: 4  Current functional level related to goals / functional outcomes: See below   Remaining deficits: Goals partially met   Education / Equipment: HEP  Plan: Patient agrees to discharge.  Patient goals were partially met. Patient is being discharged due to not returning since the last visit.  ?????   Gabriela Eves, PT, DPT 03/05/19    Patient Details  Name: WILLMAR STOCKINGER MRN: [id number] Date of Birth: 21-Jan-1950 Referring Provider (PT): Susa Day MD   Encounter Date: 09/24/2018  PT End of Session - 09/24/18 0911    Visit Number  4    Number of Visits  12    Date for PT Re-Evaluation  09/30/18    Authorization Type  FOTO AT LEAST EVERY 5TH VISIT.  PROGRESS NOTE AT 10TH VISIT.  KX MODIFIER AFTER 15 VISITS.    PT Start Time  0900    PT Stop Time  0953    PT Time Calculation (min)  53 min    Activity Tolerance  Patient tolerated treatment well    Behavior During Therapy  WFL for tasks assessed/performed       Past Medical History:  Diagnosis Date  . AAA (abdominal aortic aneurysm) ([place])   . AAA (abdominal aortic aneurysm) ([place])    has graft (Dr. at [facility])  . Anxiety   . Arthritis   . Best vitelliform macular dystrophy    folowed at [facility]  . Diabetes mellitus without complication ([place])    type 2  . Foot pain   . GERD (gastroesophageal reflux disease)   . History of kidney stones   . Macular degeneration   . Palpitations   . Varicose veins     Past Surgical History:  Procedure Laterality Date  . ABDOMINAL AORTIC ANEURYSM REPAIR  2008  . ENDOVENOUS ABLATION SAPHENOUS VEIN W/ LASER  05-24-2011   left greater saphenous vein and stab phlebectomy left leg >20 incisions  . ENDOVENOUS ABLATION SAPHENOUS VEIN W/ LASER   06-07-2011   left small saphenous Curt Jews MD  . LUMBAR LAMINECTOMY/DECOMPRESSION MICRODISCECTOMY N/A 01/09/2017   Procedure: Microlumbar decompression L4-5;  Surgeon: Susa Day, MD;  Location: [facility];  Service: Orthopedics;  Laterality: N/A;  . mastoid reconstruction     right 21 Dr. Rock Nephew  . ROTATOR CUFF REPAIR    . TYMPANOPLASTY     bil    There were no vitals filed for this visit.  Subjective Assessment - 09/24/18 0901    Subjective  COVID-19 screen performed prior to patient entering clinic. Reports treatments are helping a lot. 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 Pain is rated at 1/10 today.    Pertinent History  RTC repair, DM, previous lumbar surgery.    How long can you sit comfortably?  15 minutes.    Patient Stated Goals  Reduce pain.    Currently in Pain?  Yes    Pain Score  1     Pain Orientation  Right;Left;Lower    Pain Descriptors / Indicators  Aching    Pain Type  Chronic pain    Pain Onset  More than a month ago    Pain Frequency  Constant         [facility] PT Assessment - 09/24/18 0001  Assessment   Medical Diagnosis  Degeneration of lumbar intervertebral disc.    Referring Provider (PT)  Susa Day MD                   [facility] Adult PT Treatment/Exercise - 09/24/18 0001      Lumbar Exercises: Stretches   Single Knee to Chest Stretch  Left;Right;30 seconds;5 reps    Double Knee to Chest Stretch  5 reps;30 seconds      Lumbar Exercises: Supine   Ab Set  10 reps;5 seconds    Bent Knee Raise  20 reps;3 seconds    Bridge  20 reps    Other Supine Lumbar Exercises  supine clam shell x20 3" hold red theraband    Other Supine Lumbar Exercises  hip adduction 3" squeeze x20      Modalities   Modalities  Electrical Stimulation;Ultrasound      Moist Heat Therapy   Number Minutes Moist Heat  15 Minutes    Moist Heat Location  Lumbar Spine      Electrical Stimulation   Electrical  Stimulation Location  low back    Electrical Stimulation Action  IFC    Electrical Stimulation Parameters  80-150 hz x15 mins    Electrical Stimulation Goals  Pain      Ultrasound   Ultrasound Location  lumbar paraspinals in R sidelying    Ultrasound Parameters  1.5 w/cm2 100%, 26mz x10    Ultrasound Goals  Pain             PT Education - 09/24/18 1019    Education Details  SKTC, DKTC, draw ins, supine marching, supine clams, supine hip adduction, bridging    Person(s) Educated  Patient    Methods  Explanation;Demonstration;Handout    Comprehension  Verbalized understanding;Returned demonstration       PT Short Term Goals - 09/02/18 1218      PT SHORT TERM GOAL #1   Title  STG=LTG        PT Long Term Goals - 09/24/18 1002      PT LONG TERM GOAL #1   Title  Patient will be independent with HEP    Time  4    Period  Weeks    Status  Achieved      PT LONG TERM GOAL #2   Title  Sit 30 minutes with pain not > 3/10.    Time  4    Period  Weeks    Status  Achieved      PT LONG TERM GOAL #3   Title  No radiation of pain into bilateral hips.    Time  4    Period  Weeks    Status  On-going   "occasionally"           Plan - 09/24/18 0912    Clinical Impression Statement  Patient responded well to therapy with no increse of low back pain. Patient was able to perform all exercises with proper form and technique. Patient denied pain or discomfort with [place]. LTG #1 & #2 has been met; patient occasionally feels bilateral radiating symptoms to bilateral hips.    Personal Factors and Comorbidities  Comorbidity 1;Age    Comorbidities  RTC repair, DM, previous lumbar surgery.    Examination-Activity Limitations  Sit    Stability/Clinical Decision Making  Evolving/Moderate complexity    Clinical Decision Making  Low    Rehab Potential  Good    PT Frequency  3x /  week    PT Duration  4 weeks    PT Treatment/Interventions  ADLs/Self Care Home Management;Electrical  Stimulation;Ultrasound;Moist Heat;Therapeutic activities;Therapeutic exercise;Manual techniques;Patient/family education;Passive range of motion;Dry needling    PT Next Visit Plan  Please begin with HMP/E'stim, combo e'stim/U/S and STW/M, S and DKTC, hip bridges and core exercise progression.    Consulted and Agree with Plan of Care  Patient       Patient will benefit from skilled therapeutic intervention in order to improve the following deficits and impairments:  Pain, Postural dysfunction, Decreased activity tolerance, Decreased range of motion  Visit Diagnosis: Chronic bilateral low back pain without sciatica  Muscle weakness (generalized)     Problem List Patient Active Problem List   Diagnosis Date Noted  . Lumbar spinal stenosis 01/09/2017  . Spinal stenosis at L4-L5 level 01/09/2017  . Varicose veins of lower extremities with other complications 03/70/4888  . Chronic venous insufficiency 12/15/2010    Gabriela Eves, PT, DPT 09/24/2018, 10:20 AM  [facility] [address] [place], [place], [address] Phone: [phone]   Fax:  [phone]  Name: BRAYSEN CLOWARD MRN: [id number] Date of Birth: 12-07-1950

## 2018-09-30 ENCOUNTER — Ambulatory Visit: Payer: Medicare HMO | Admitting: *Deleted

## 2019-06-25 ENCOUNTER — Encounter: Payer: Self-pay | Admitting: Physician Assistant

## 2019-06-25 ENCOUNTER — Other Ambulatory Visit: Payer: Self-pay

## 2019-06-25 ENCOUNTER — Ambulatory Visit: Payer: Medicare HMO | Admitting: Physician Assistant

## 2019-06-25 DIAGNOSIS — Z85828 Personal history of other malignant neoplasm of skin: Secondary | ICD-10-CM | POA: Diagnosis not present

## 2019-06-25 DIAGNOSIS — L570 Actinic keratosis: Secondary | ICD-10-CM | POA: Diagnosis not present

## 2019-06-25 DIAGNOSIS — L219 Seborrheic dermatitis, unspecified: Secondary | ICD-10-CM | POA: Diagnosis not present

## 2019-06-25 MED ORDER — CICLOPIROX OLAMINE 0.77 % EX CREA: TOPICAL_CREAM | Freq: Two times a day (BID) | CUTANEOUS | 2 refills | Status: DC

## 2019-06-25 MED ORDER — HYDROCORTISONE 2.5 % EX CREA: TOPICAL_CREAM | Freq: Two times a day (BID) | CUTANEOUS | 3 refills | Status: DC | PRN

## 2019-06-25 MED ORDER — BETAMETHASONE DIPROPIONATE AUG 0.05 % EX LOTN: TOPICAL_LOTION | Freq: Two times a day (BID) | CUTANEOUS | 2 refills | Status: DC

## 2019-06-25 NOTE — Progress Notes (Signed)
   Follow up Visit  Subjective  Peter [name] is a 69 y.o. male who presents for the following: Skin Problem (whelps and bumps and blisters on scalp and very itchy and nothing over the counter helps) and Rash (dry patchy skin around nose and ears and uses HC and clotrimazole every other night). Scalp itches all the time and he has tried many medications and dandruff shampoo. Some actually make it worse. It has gotten worse since his last visit with Dr Denna Haggard. It wakes him up at night. He has dry patches and he feels like he has "blisters" on his scalp. Sister has a gluten allergy and that caused issues with her scalp. Dr. Denna Haggard never treated the scalp. Patient felt like he could handle it with OTC products but he never could find a medicine that worked. He was given clotrimazole and hydrocortisone to mix for the seb derm on his face.   Objective  Well appearing patient in no apparent distress; mood and affect are within normal limits.  All skin waist up examined. No suspicious moles noted on back.   Objective  Right Scaphoid Fossa (4): Erythematous patches with gritty scale.  Objective  Left Eyebrow, Mid Occipital Scalp, Mid Parietal Scalp, Right Eyebrow: Erythematous plaques with greasy scale.   Assessment & Plan  AK (actinic keratosis) (4) Right Scaphoid Fossa  Destruction of lesion - Right Scaphoid Fossa Complexity: simple   Destruction method: cryotherapy   Informed consent: discussed and consent obtained   Timeout:  patient name, date of birth, surgical site, and procedure verified Lesion destroyed using liquid nitrogen: Yes   Outcome: patient tolerated procedure well with no complications    Seborrheic dermatitis (4) Left Eyebrow; Right Eyebrow; Mid Parietal Scalp; Mid Occipital Scalp  betamethasone, augmented, (DIPROLENE) 0.05 % lotion - Mid Occipital Scalp, Mid Parietal Scalp  hydrocortisone 2.5 % cream - Left Eyebrow, Right Eyebrow  ciclopirox (LOPROX) 0.77 % cream  - Left Eyebrow, Right Eyebrow  History of basal cell carcinoma (BCC) Right chest

## 2019-08-05 ENCOUNTER — Ambulatory Visit: Payer: Medicare HMO | Admitting: Physician Assistant

## 2019-09-17 ENCOUNTER — Ambulatory Visit: Payer: Medicare HMO | Admitting: Physician Assistant

## 2019-12-14 ENCOUNTER — Telehealth: Payer: Self-pay

## 2019-12-14 MED ORDER — BETAMETHASONE DIPROPIONATE 0.05 % EX LOTN: TOPICAL_LOTION | Freq: Two times a day (BID) | CUTANEOUS | 11 refills | Status: DC | PRN

## 2019-12-14 NOTE — Telephone Encounter (Signed)
refill 

## 2020-03-23 ENCOUNTER — Ambulatory Visit: Payer: Medicare HMO | Admitting: Physician Assistant

## 2020-05-03 ENCOUNTER — Telehealth: Payer: Self-pay | Admitting: *Deleted

## 2020-05-03 NOTE — Telephone Encounter (Signed)
Received faxed stating patient duplicating therapy- patient is using Betamethasone lotion & hydrocortisone cream - patient is using each one on different locations per office visit not- not a duplication of medication.

## 2020-07-06 ENCOUNTER — Other Ambulatory Visit: Payer: Self-pay | Admitting: *Deleted

## 2020-07-06 DIAGNOSIS — L219 Seborrheic dermatitis, unspecified: Secondary | ICD-10-CM

## 2020-07-06 MED ORDER — HYDROCORTISONE 2.5 % EX CREA: TOPICAL_CREAM | Freq: Two times a day (BID) | CUTANEOUS | 0 refills | Status: DC | PRN

## 2021-03-13 ENCOUNTER — Encounter: Payer: Self-pay | Admitting: Dermatology

## 2021-03-13 ENCOUNTER — Other Ambulatory Visit: Payer: Self-pay

## 2021-03-13 ENCOUNTER — Ambulatory Visit: Payer: Medicare HMO | Admitting: Dermatology

## 2021-03-13 DIAGNOSIS — D0462 Carcinoma in situ of skin of left upper limb, including shoulder: Secondary | ICD-10-CM | POA: Diagnosis not present

## 2021-03-13 DIAGNOSIS — D0461 Carcinoma in situ of skin of right upper limb, including shoulder: Secondary | ICD-10-CM

## 2021-03-13 DIAGNOSIS — L570 Actinic keratosis: Secondary | ICD-10-CM

## 2021-03-13 DIAGNOSIS — Z1283 Encounter for screening for malignant neoplasm of skin: Secondary | ICD-10-CM

## 2021-03-13 DIAGNOSIS — L821 Other seborrheic keratosis: Secondary | ICD-10-CM

## 2021-03-13 DIAGNOSIS — L729 Follicular cyst of the skin and subcutaneous tissue, unspecified: Secondary | ICD-10-CM | POA: Diagnosis not present

## 2021-03-13 DIAGNOSIS — D485 Neoplasm of uncertain behavior of skin: Secondary | ICD-10-CM

## 2021-03-13 NOTE — Patient Instructions (Signed)

## 2021-03-14 ENCOUNTER — Other Ambulatory Visit: Payer: Self-pay | Admitting: Dermatology

## 2021-03-22 ENCOUNTER — Telehealth: Payer: Self-pay | Admitting: *Deleted

## 2021-03-22 NOTE — Telephone Encounter (Signed)
Pathology to patient wife vivian. ?

## 2021-03-24 ENCOUNTER — Encounter: Payer: Self-pay | Admitting: Dermatology

## 2021-03-24 NOTE — Progress Notes (Signed)
Follow-Up Visit   Subjective  Peter [name] is a 71 y.o. male who presents for the following: Skin Problem (Lesions on both hands x 7 months -itching. Pt states he also needs a refill ).  General skin examination, new crusts on hands and ear Location:  Duration:  Quality:  Associated Signs/Symptoms: Modifying Factors:  Severity:  Timing: Context:   Objective  Well appearing patient in no apparent distress; mood and affect are within normal limits. Scalp Waist up exam: No atypical pigmented lesions.  2 possible new nonmelanoma skin cancers hands will be biopsied and treated.  Mid Back (4) Brown 6 mm flattopped textured papule, typical dermoscopy  Left Mid Helix 3 mm right noninflamed dermal papule  Right Superior Helix (4) 4 mm gritty pink crust  Left Dorsal Hand 1.1 cm thick crust       Right Proximal 3rd Finger 0.7 cm thick crust             All skin waist up examined.   Assessment & Plan    Encounter for screening for malignant neoplasm of skin Scalp  Annual skin examination  Seborrheic keratosis (4) Mid Back  Leave if stable  Cyst of skin Left Mid Helix  No intervention indicated  AK (actinic keratosis) (4) Right Superior Helix  Destruction of lesion - Right Superior Helix Complexity: simple   Destruction method: cryotherapy   Informed consent: discussed and consent obtained   Timeout:  patient name, date of birth, surgical site, and procedure verified Lesion destroyed using liquid nitrogen: Yes   Cryotherapy cycles:  3 Outcome: patient tolerated procedure well with no complications   Post-procedure details: wound care instructions given    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 Left Dorsal Hand  Skin / nail biopsy Type of biopsy: tangential   Informed consent: discussed and consent obtained   Timeout: patient name, date of birth, surgical site, and procedure verified   Anesthesia: the lesion  was anesthetized in a standard fashion   Anesthetic:  1% lidocaine w/ epinephrine 1-100,000 local infiltration Instrument used: flexible razor blade   Hemostasis achieved with: aluminum chloride and electrodesiccation   Outcome: patient tolerated procedure well   Post-procedure details: wound care instructions given    Destruction of lesion Complexity: simple   Destruction method: electrodesiccation and curettage   Informed consent: discussed and consent obtained   Timeout:  patient name, date of birth, surgical site, and procedure verified Anesthesia: the lesion was anesthetized in a standard fashion   Anesthetic:  1% lidocaine w/ epinephrine 1-100,000 local infiltration Curettage performed in three different directions: Yes   Electrodesiccation performed over the curetted area: Yes   Curettage cycles:  3 Lesion length (cm):  1.1 Lesion width (cm):  1.1 Margin per side (cm):  0 Final wound size (cm):  1.1 Hemostasis achieved with:  aluminum chloride Outcome: patient tolerated procedure well with no complications   Post-procedure details: wound care instructions given    Specimen 1 - Surgical pathology Differential Diagnosis: bcc vs scc txpbx  Check Margins: No  Squamous cell carcinoma in situ of skin of finger of right hand Right Proximal 3rd Finger  Skin / nail biopsy Type of biopsy: tangential   Informed consent: discussed and consent obtained   Timeout: patient name, date of birth, surgical site, and procedure verified   Anesthesia: the lesion was anesthetized in a standard fashion   Anesthetic:  1% lidocaine w/ epinephrine 1-100,000 local infiltration Instrument used: flexible  razor blade   Hemostasis achieved with: aluminum chloride and electrodesiccation   Outcome: patient tolerated procedure well   Post-procedure details: wound care instructions given    Destruction of lesion Complexity: simple   Destruction method: electrodesiccation and curettage   Informed  consent: discussed and consent obtained   Timeout:  patient name, date of birth, surgical site, and procedure verified Anesthesia: the lesion was anesthetized in a standard fashion   Anesthetic:  1% lidocaine w/ epinephrine 1-100,000 local infiltration Curettage performed in three different directions: Yes   Electrodesiccation performed over the curetted area: Yes   Curettage cycles:  3 Lesion length (cm):  0.8 Lesion width (cm):  0.8 Margin per side (cm):  0 Final wound size (cm):  0.8 Hemostasis achieved with:  aluminum chloride Outcome: patient tolerated procedure well with no complications   Post-procedure details: wound care instructions given    Specimen 2 - Surgical pathology Differential Diagnosis: scc vs bcc txpbx  Check Margins: No      I, Lavonna Monarch, MD, have reviewed all documentation for this visit.  The documentation on 03/24/21 for the exam, diagnosis, procedures, and orders are all accurate and complete.

## 2021-06-08 ENCOUNTER — Other Ambulatory Visit: Payer: Self-pay | Admitting: Dermatology

## 2021-07-19 ENCOUNTER — Ambulatory Visit: Payer: Medicare HMO | Attending: Specialist | Admitting: Physical Therapy

## 2021-07-19 ENCOUNTER — Telehealth: Payer: Self-pay

## 2021-07-19 ENCOUNTER — Encounter: Payer: Self-pay | Admitting: Physical Therapy

## 2021-07-19 DIAGNOSIS — R293 Abnormal posture: Secondary | ICD-10-CM | POA: Insufficient documentation

## 2021-07-19 DIAGNOSIS — L219 Seborrheic dermatitis, unspecified: Secondary | ICD-10-CM

## 2021-07-19 DIAGNOSIS — M5459 Other low back pain: Secondary | ICD-10-CM | POA: Insufficient documentation

## 2021-07-19 MED ORDER — HYDROCORTISONE 2.5 % EX CREA: TOPICAL_CREAM | Freq: Two times a day (BID) | CUTANEOUS | 4 refills | Status: DC | PRN
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
## 2021-07-19 NOTE — Telephone Encounter (Signed)
Ok refill? 

## 2021-07-24 ENCOUNTER — Encounter: Payer: Medicare HMO | Admitting: Physical Therapy

## 2021-07-25 ENCOUNTER — Encounter: Payer: Medicare HMO | Admitting: Physical Therapy

## 2021-07-28 ENCOUNTER — Ambulatory Visit: Payer: Medicare HMO | Admitting: Physical Therapy

## 2021-07-28 ENCOUNTER — Encounter: Payer: Self-pay | Admitting: Physical Therapy

## 2021-07-28 DIAGNOSIS — M5459 Other low back pain: Secondary | ICD-10-CM | POA: Diagnosis not present

## 2021-07-28 DIAGNOSIS — R293 Abnormal posture: Secondary | ICD-10-CM

## 2021-07-28 NOTE — Therapy (Addendum)
OUTPATIENT PHYSICAL THERAPY THORACOLUMBAR TREATMENT   Patient Name: Peter [name] MRN: [id number] DOB:01-Jul-1950, 71 y.o., male Today's Date: 07/28/2021   PT End of Session - 07/28/21 1037     Visit Number 1    Number of Visits 12    Date for PT Re-Evaluation 08/16/21    PT Start Time 0904   PT Stop Time 1000   PT Time Calculation (min) 56 min    Activity Tolerance Patient tolerated treatment well    Behavior During Therapy [facility] for tasks assessed/performed             Past Medical History:  Diagnosis Date   AAA (abdominal aortic aneurysm) ([place])    AAA (abdominal aortic aneurysm) ([place])    has graft (Dr. at [facility])   [place] (basal cell carcinoma of skin) 11/29/2005   Right Chest (curet and 5FU)   Best vitelliform macular dystrophy    folowed at [facility]   Diabetes mellitus without complication ([place])    type 2   Foot pain    GERD (gastroesophageal reflux disease)    History of kidney stones    Macular degeneration    Palpitations    Varicose veins    Past Surgical History:  Procedure Laterality Date   ABDOMINAL AORTIC ANEURYSM REPAIR  2008   ENDOVENOUS ABLATION SAPHENOUS VEIN W/ LASER  05-24-2011   left greater saphenous vein and stab phlebectomy left leg >20 incisions   ENDOVENOUS ABLATION SAPHENOUS VEIN W/ LASER  06-07-2011   left small saphenous Curt Jews MD   LUMBAR LAMINECTOMY/DECOMPRESSION MICRODISCECTOMY N/A 01/09/2017   Procedure: Microlumbar decompression L4-5;  Surgeon: Susa Day, MD;  Location: [facility];  Service: Orthopedics;  Laterality: N/A;   mastoid reconstruction     right 1972 Dr. Rock Nephew   ROTATOR CUFF REPAIR     TYMPANOPLASTY     bil   Patient Active Problem List   Diagnosis Date Noted   Lumbar spinal stenosis 01/09/2017   Spinal stenosis at L4-L5 level 01/09/2017   Varicose veins of lower extremities with other complications 40/34/7425   Chronic venous insufficiency 12/15/2010     REFERRING PROVIDER:  Susa Day MD  REFERRING DIAG: Vertebrogenic low back pain.  Rationale for Evaluation and Treatment Rehabilitation  THERAPY DIAG:  Other low back pain  Abnormal posture  ONSET DATE: One month ago.  SUBJECTIVE:                                                                                                                                                                                           SUBJECTIVE  STATEMENT: Doing better.  Pain at a 4/10. PERTINENT HISTORY:   Right foot injury, left ankle ORIF, lumbar surgery, Right RTC repair, AAA.  PAIN:  Are you having pain? Yes: NPRS scale: 4/10 Pain location: Across low back and into buttocks and hips. Pain description: Ache, sore, sharp shooting. Aggravating factors: "Any activity." Relieving factors: Pain medication.   TODAY'S TREATMENT  Patient in right sdly position with folded pillow between knees for comfort and received Combo e'stim/US at 1.50 W/CM2 x STW/M x 11 minutes to patient affected lumbar region f/b HMP and IFC at 80-150 Hz at 40% scan x 20 minutes to patient lower lumbar region.  Patient tolerated tx without complaint.   ASSESSMENT:  CLINICAL IMPRESSION: Patient with a lowered pain-level today and states his first treatment was helpful.  Her tolerated treatment today without complaint with normal modality response upon removal of modality.        LONG TERM GOALS: Target date: 08/25/2021  Ind with a HEP. Baseline:  Goal status: INITIAL  2.  Perform ADL's with pain not > 4/10. Baseline:  Goal status: INITIAL  3.  Eliminate LE symptoms. Baseline:  Goal status: INITIAL  4.  Stand 20 minutes with pain not > 4/10. Baseline:  Goal status: INITIAL  5.  Perform transitory movement with a reported 50% greater ease. Baseline:  Goal status: INITIAL    PLAN: PT FREQUENCY: 3x/week  PT DURATION: 4 weeks  PLANNED INTERVENTIONS: Therapeutic exercises, Therapeutic activity, Patient/Family education,  Joint mobilization, Dry Needling, Electrical stimulation, Cryotherapy, Moist heat, and Ultrasound.  PLAN FOR NEXT SESSION: Modalites and STW/M.  Slow progression into core exercise progression.   Kimimila Tauzin, [place], PT 07/28/2021, 10:34 AM

## 2021-08-01 ENCOUNTER — Ambulatory Visit: Payer: Medicare HMO | Admitting: Physical Therapy

## 2021-08-01 ENCOUNTER — Encounter: Payer: Self-pay | Admitting: Physical Therapy

## 2021-08-01 DIAGNOSIS — M5459 Other low back pain: Secondary | ICD-10-CM | POA: Diagnosis not present

## 2021-08-01 DIAGNOSIS — R293 Abnormal posture: Secondary | ICD-10-CM

## 2021-08-01 NOTE — Therapy (Signed)
OUTPATIENT PHYSICAL THERAPY THORACOLUMBAR TREATMENT   Patient Name: Peter [name] MRN: [id number] DOB:1950-08-21, 71 y.o., male Today's Date: 08/01/2021   PT End of Session - 07/28/21 1037     Visit Number 2   Number of Visits 12    Date for PT Re-Evaluation 08/16/21    PT Start Time 0818   PT Stop Time 909   PT Time Calculation (min) 51 mins   Activity Tolerance Patient tolerated treatment well    Behavior During Therapy [facility] for tasks assessed/performed             Past Medical History:  Diagnosis Date   AAA (abdominal aortic aneurysm) ([place])    AAA (abdominal aortic aneurysm) ([place])    has graft (Dr. at [facility])   [place] (basal cell carcinoma of skin) 11/29/2005   Right Chest (curet and 5FU)   Best vitelliform macular dystrophy    folowed at [facility]   Diabetes mellitus without complication ([place])    type 2   Foot pain    GERD (gastroesophageal reflux disease)    History of kidney stones    Macular degeneration    Palpitations    Varicose veins    Past Surgical History:  Procedure Laterality Date   ABDOMINAL AORTIC ANEURYSM REPAIR  2008   ENDOVENOUS ABLATION SAPHENOUS VEIN W/ LASER  05-24-2011   left greater saphenous vein and stab phlebectomy left leg >20 incisions   ENDOVENOUS ABLATION SAPHENOUS VEIN W/ LASER  06-07-2011   left small saphenous Curt Jews MD   LUMBAR LAMINECTOMY/DECOMPRESSION MICRODISCECTOMY N/A 01/09/2017   Procedure: Microlumbar decompression L4-5;  Surgeon: Susa Day, MD;  [facility];  Service: Orthopedics;  Laterality: N/A;   mastoid reconstruction     right 1972 Dr. Rock Nephew   ROTATOR CUFF REPAIR     TYMPANOPLASTY     bil   Patient Active Problem List   Diagnosis Date Noted   Lumbar spinal stenosis 01/09/2017   Spinal stenosis at L4-L5 level 01/09/2017   Varicose veins of lower extremities with other complications 84/66/5993   Chronic venous insufficiency 12/15/2010     REFERRING PROVIDER:  Susa Day MD  REFERRING DIAG: Vertebrogenic low back pain.  Rationale for Evaluation and Treatment Rehabilitation  THERAPY DIAG:  Other low back pain  Abnormal posture  ONSET DATE: One month ago.  SUBJECTIVE:                                                                                                                                                                                           SUBJECTIVE STATEMENT: Doing  better.  Pain at a 4/10. PERTINENT HISTORY:   Right foot injury, left ankle ORIF, lumbar surgery, Right RTC repair, AAA.  PAIN:  Are you having pain? Yes: NPRS scale: 4/10 Pain location: Across low back and into buttocks and hips. Pain description: Ache, sore, sharp shooting. Aggravating factors: "Any activity." Relieving factors: Pain medication.   TODAY'S TREATMENT  Nustep level 3 x 15 minutes f/b back ext with 60# x 4 minutes f/b ham curls with 60# x 4 minutes f/b HMP and IFC at 80-150 Hz at 40% scan x 20 minutes to patient lower lumbar region.  Patient tolerated tx without complaint.   ASSESSMENT:  CLINICAL IMPRESSION: Patient did well with resisted back extension and resisted abdominal work.  Performed exercise with excellent technique.  Normal modality response following removal of modality.      LONG TERM GOALS: Target date: 08/29/2021  Ind with a HEP. Baseline:  Goal status: INITIAL  2.  Perform ADL's with pain not > 4/10. Baseline:  Goal status: INITIAL  3.  Eliminate LE symptoms. Baseline:  Goal status: INITIAL  4.  Stand 20 minutes with pain not > 4/10. Baseline:  Goal status: INITIAL  5.  Perform transitory movement with a reported 50% greater ease. Baseline:  Goal status: INITIAL    PLAN: PT FREQUENCY: 3x/week  PT DURATION: 4 weeks  PLANNED INTERVENTIONS: Therapeutic exercises, Therapeutic activity, Patient/Family education, Joint mobilization, Dry Needling, Electrical stimulation, Cryotherapy, Moist heat, and  Ultrasound.  PLAN FOR NEXT SESSION: Modalites and STW/M.  Slow progression into core exercise progression.   Danaya Geddis, [place], PT 08/01/2021, 8:36 AM

## 2021-08-03 ENCOUNTER — Ambulatory Visit: Payer: Medicare HMO

## 2021-08-03 DIAGNOSIS — R293 Abnormal posture: Secondary | ICD-10-CM

## 2021-08-03 DIAGNOSIS — M5459 Other low back pain: Secondary | ICD-10-CM | POA: Diagnosis not present

## 2021-08-03 NOTE — Therapy (Signed)
OUTPATIENT PHYSICAL THERAPY THORACOLUMBAR TREATMENT   Patient Name: ADOM SCHOENECK MRN: [id number] DOB:1950/08/13, 71 y.o., male Today's Date: 08/03/2021    PT End of Session - 08/03/21 0912     Visit Number 4    Number of Visits 12    Date for PT Re-Evaluation 08/16/21    PT Start Time 0900    PT Stop Time 0945    PT Time Calculation (min) 45 min    Activity Tolerance Patient tolerated treatment well    Behavior During Therapy [facility] for tasks assessed/performed              Past Medical History:  Diagnosis Date   AAA (abdominal aortic aneurysm) ([place])    AAA (abdominal aortic aneurysm) ([place])    has graft (Dr. at [facility])   [place] (basal cell carcinoma of skin) 11/29/2005   Right Chest (curet and 5FU)   Best vitelliform macular dystrophy    folowed at [facility]   Diabetes mellitus without complication ([place])    type 2   Foot pain    GERD (gastroesophageal reflux disease)    History of kidney stones    Macular degeneration    Palpitations    Varicose veins    Past Surgical History:  Procedure Laterality Date   ABDOMINAL AORTIC ANEURYSM REPAIR  2008   ENDOVENOUS ABLATION SAPHENOUS VEIN W/ LASER  05-24-2011   left greater saphenous vein and stab phlebectomy left leg >20 incisions   ENDOVENOUS ABLATION SAPHENOUS VEIN W/ LASER  06-07-2011   left small saphenous Curt Jews MD   LUMBAR LAMINECTOMY/DECOMPRESSION MICRODISCECTOMY N/A 01/09/2017   Procedure: Microlumbar decompression L4-5;  Surgeon: Susa Day, MD;  [facility];  Service: Orthopedics;  Laterality: N/A;   mastoid reconstruction     right 1972 Dr. Rock Nephew   ROTATOR CUFF REPAIR     TYMPANOPLASTY     bil   Patient Active Problem List   Diagnosis Date Noted   Lumbar spinal stenosis 01/09/2017   Spinal stenosis at L4-L5 level 01/09/2017   Varicose veins of lower extremities with other complications 74/08/1446   Chronic venous insufficiency 12/15/2010     REFERRING PROVIDER:  Susa Day MD  REFERRING DIAG: Vertebrogenic low back pain.  Rationale for Evaluation and Treatment Rehabilitation  THERAPY DIAG:  Other low back pain  Abnormal posture  ONSET DATE: One month ago.  SUBJECTIVE:  SUBJECTIVE STATEMENT: Patient reports that his back is back up to about a 6/10. He notes that it started getting a little worse yesterday morning. He notes that he has had the hiccups for the past 4 days and is scheduled to see his physician later today.   PERTINENT HISTORY:   Right foot injury, left ankle ORIF, lumbar surgery, Right RTC repair, AAA.  PAIN:  Are you having pain? Yes: NPRS scale: 6/10 Pain location: Across low back and into buttocks and hips. Pain description: Ache, sore, sharp shooting. Aggravating factors: "Any activity." Relieving factors: Pain medication.   TODAY'S TREATMENT                                    7/20 EXERCISE LOG  Exercise Repetitions and Resistance Comments  Nustep L3 x 15 minutes    Pull down  Blue XTS x 30 reps    Slouch / overcorrect Attempted Limited due to abdominal discomfort  Wood chops  Blue XTS x 20 reps each         Blank cell = exercise not performed today  Manual Therapy Soft Tissue Mobilization: bilateral lumbar paraspinals and gluteals, for pain relief Joint Mobilizations: lower lumbar CPA's, grade I-IV for reduced pain and joint mobility     ASSESSMENT:  CLINICAL IMPRESSION: Patient presented to treatment with increased low back pain since his last appointment. He was introduced to multiple new interventions for improved lumbar stability. He required minimal cueing with resisted pull downs for controlled mobility to facilitate increased latissimus dorsi engagement. Manual therapy was utilized for reduced pain with lumbar joint  mobilizations being the most effective. He reported that his back felt a lot better upon the conclusion of treatment as it was not hurting while he was standing. He continues to require skilled physical therapy to address his remaining impairments to return to his prior level of function.       LONG TERM GOALS: Target date: 08/31/2021  Ind with a HEP. Baseline:  Goal status: INITIAL  2.  Perform ADL's with pain not > 4/10. Baseline:  Goal status: INITIAL  3.  Eliminate LE symptoms. Baseline:  Goal status: INITIAL  4.  Stand 20 minutes with pain not > 4/10. Baseline:  Goal status: INITIAL  5.  Perform transitory movement with a reported 50% greater ease. Baseline:  Goal status: INITIAL    PLAN: PT FREQUENCY: 3x/week  PT DURATION: 4 weeks  PLANNED INTERVENTIONS: Therapeutic exercises, Therapeutic activity, Patient/Family education, Joint mobilization, Dry Needling, Electrical stimulation, Cryotherapy, Moist heat, and Ultrasound.  PLAN FOR NEXT SESSION: Modalites and STW/M.  Slow progression into core exercise progression.   Darlin Coco, PT 08/03/2021, 9:13 AM

## 2021-08-08 ENCOUNTER — Ambulatory Visit: Payer: Medicare HMO | Admitting: Physical Therapy

## 2021-08-08 ENCOUNTER — Encounter: Payer: Self-pay | Admitting: Physical Therapy

## 2021-08-08 DIAGNOSIS — M5459 Other low back pain: Secondary | ICD-10-CM

## 2021-08-08 DIAGNOSIS — R293 Abnormal posture: Secondary | ICD-10-CM

## 2021-08-08 NOTE — Therapy (Signed)
OUTPATIENT PHYSICAL THERAPY THORACOLUMBAR TREATMENT   Patient Name: Peter [name] MRN: [id number] DOB:1950/06/16, 71 y.o., male Today's Date: 08/08/2021    PT End of Session - 08/08/21 0856     Visit Number 5    Number of Visits 12    Date for PT Re-Evaluation 08/16/21    PT Start Time 0815    PT Stop Time 0909    PT Time Calculation (min) 54 min    Activity Tolerance Patient tolerated treatment well    Behavior During Therapy [facility] for tasks assessed/performed               Past Medical History:  Diagnosis Date   AAA (abdominal aortic aneurysm) ([place])    AAA (abdominal aortic aneurysm) ([place])    has graft (Dr. at [facility])   [place] (basal cell carcinoma of skin) 11/29/2005   Right Chest (curet and 5FU)   Best vitelliform macular dystrophy    folowed at [facility]   Diabetes mellitus without complication ([place])    type 2   Foot pain    GERD (gastroesophageal reflux disease)    History of kidney stones    Macular degeneration    Palpitations    Varicose veins    Past Surgical History:  Procedure Laterality Date   ABDOMINAL AORTIC ANEURYSM REPAIR  2008   ENDOVENOUS ABLATION SAPHENOUS VEIN W/ LASER  05-24-2011   left greater saphenous vein and stab phlebectomy left leg >20 incisions   ENDOVENOUS ABLATION SAPHENOUS VEIN W/ LASER  06-07-2011   left small saphenous Curt Jews MD   LUMBAR LAMINECTOMY/DECOMPRESSION MICRODISCECTOMY N/A 01/09/2017   Procedure: Microlumbar decompression L4-5;  Surgeon: Susa Day, MD;  [facility];  Service: Orthopedics;  Laterality: N/A;   mastoid reconstruction     right 1972 Dr. Rock Nephew   ROTATOR CUFF REPAIR     TYMPANOPLASTY     bil   Patient Active Problem List   Diagnosis Date Noted   Lumbar spinal stenosis 01/09/2017   Spinal stenosis at L4-L5 level 01/09/2017   Varicose veins of lower extremities with other complications 00/17/4944   Chronic venous insufficiency 12/15/2010     REFERRING  PROVIDER: Susa Day MD  REFERRING DIAG: Vertebrogenic low back pain.  Rationale for Evaluation and Treatment Rehabilitation  THERAPY DIAG:  Other low back pain  Abnormal posture  ONSET DATE: One month ago.  SUBJECTIVE:  SUBJECTIVE STATEMENT: Pain at a 7 today.  PERTINENT HISTORY:   Right foot injury, left ankle ORIF, lumbar surgery, Right RTC repair, AAA.  PAIN:  Are you having pain? Yes: NPRS scale: 7/10 Pain location: Across low back and into buttocks and hips. Pain description: Ache, sore, sharp shooting. Aggravating factors: "Any activity." Relieving factors: Pain medication.   TODAY'S TREATMENT                                    7/25 EXERCISE LOG  Exercise Repetitions and Resistance Comments  Nustep L3 x 15 minutes    Back ext 60# 4 minutes   Ab curl 60# 4 minutes.            Blank cell = exercise not performed today  HMP and IFC at 80-150 Hz 40% scan x 20 minutes.  ASSESSMENT:  CLINICAL IMPRESSION: Increased pain since last treatment.  Performed low resistance back/ab machine over a total of 8 minutes with excellent technique.      LONG TERM GOALS: Target date: 08/31/2021  Ind with a HEP. Baseline:  Goal status: INITIAL  2.  Perform ADL's with pain not > 4/10. Baseline:  Goal status: INITIAL  3.  Eliminate LE symptoms. Baseline:  Goal status: INITIAL  4.  Stand 20 minutes with pain not > 4/10. Baseline:  Goal status: INITIAL  5.  Perform transitory movement with a reported 50% greater ease. Baseline:  Goal status: INITIAL    PLAN: PT FREQUENCY: 3x/week  PT DURATION: 4 weeks  PLANNED INTERVENTIONS: Therapeutic exercises, Therapeutic activity, Patient/Family education, Joint mobilization, Dry Needling, Electrical stimulation, Cryotherapy, Moist heat,  and Ultrasound.  PLAN FOR NEXT SESSION: Modalites and STW/M.  Slow progression into core exercise progression.   Tami Barren, [place], PT 08/08/2021, 9:49 AM

## 2021-08-11 ENCOUNTER — Encounter: Payer: Medicare HMO | Admitting: Physical Therapy

## 2021-08-15 ENCOUNTER — Ambulatory Visit: Payer: Medicare HMO | Attending: Specialist | Admitting: Physical Therapy

## 2021-08-15 ENCOUNTER — Encounter: Payer: Self-pay | Admitting: Physical Therapy

## 2021-08-15 DIAGNOSIS — R293 Abnormal posture: Secondary | ICD-10-CM | POA: Insufficient documentation

## 2021-08-15 DIAGNOSIS — M5459 Other low back pain: Secondary | ICD-10-CM | POA: Insufficient documentation

## 2021-08-15 NOTE — Therapy (Signed)
OUTPATIENT PHYSICAL THERAPY THORACOLUMBAR TREATMENT   Patient Name: Peter [name] MRN: [id number] DOB:06-22-1950, 71 y.o., male Today's Date: 08/15/2021    PT End of Session - 08/15/21 0841     Visit Number 6    Number of Visits 12    Date for PT Re-Evaluation 08/16/21    PT Start Time 0823    PT Stop Time 0912    PT Time Calculation (min) 49 min    Activity Tolerance Patient tolerated treatment well    Behavior During Therapy [facility] for tasks assessed/performed                Past Medical History:  Diagnosis Date   AAA (abdominal aortic aneurysm) ([place])    AAA (abdominal aortic aneurysm) ([place])    has graft (Dr. at [facility])   [place] (basal cell carcinoma of skin) 11/29/2005   Right Chest (curet and 5FU)   Best vitelliform macular dystrophy    folowed at [facility]   Diabetes mellitus without complication ([place])    type 2   Foot pain    GERD (gastroesophageal reflux disease)    History of kidney stones    Macular degeneration    Palpitations    Varicose veins    Past Surgical History:  Procedure Laterality Date   ABDOMINAL AORTIC ANEURYSM REPAIR  2008   ENDOVENOUS ABLATION SAPHENOUS VEIN W/ LASER  05-24-2011   left greater saphenous vein and stab phlebectomy left leg >20 incisions   ENDOVENOUS ABLATION SAPHENOUS VEIN W/ LASER  06-07-2011   left small saphenous Curt Jews MD   LUMBAR LAMINECTOMY/DECOMPRESSION MICRODISCECTOMY N/A 01/09/2017   Procedure: Microlumbar decompression L4-5;  Surgeon: Susa Day, MD;  [facility];  Service: Orthopedics;  Laterality: N/A;   mastoid reconstruction     right 1972 Dr. Rock Nephew   ROTATOR CUFF REPAIR     TYMPANOPLASTY     bil   Patient Active Problem List   Diagnosis Date Noted   Lumbar spinal stenosis 01/09/2017   Spinal stenosis at L4-L5 level 01/09/2017   Varicose veins of lower extremities with other complications 85/92/9244   Chronic venous insufficiency 12/15/2010     REFERRING  PROVIDER: Susa Day MD  REFERRING DIAG: Vertebrogenic low back pain.  Rationale for Evaluation and Treatment Rehabilitation  THERAPY DIAG:  Other low back pain  Abnormal posture  ONSET DATE: One month ago.  SUBJECTIVE:  SUBJECTIVE STATEMENT: Injection in left hip helped.  Right hip hurts. PERTINENT HISTORY:   Right foot injury, left ankle ORIF, lumbar surgery, Right RTC repair, AAA.  PAIN:  Are you having pain? Yes: NPRS scale: 3/10 Pain location: Across low back and into buttocks and hips. Pain description: Ache, sore, sharp shooting. Aggravating factors: "Any activity." Relieving factors: Pain medication.   TODAY'S TREATMENT                                    08/15/21 EXERCISE LOG  Exercise Repetitions and Resistance Comments  Nustep L4 x 15 minutes    Back ext 60# 4 minutes   Ab curl 80# 4 minutes.            Blank cell = exercise not performed today  HMP and IFC at 80-150 Hz 40% scan x 20 minutes.  ASSESSMENT:  CLINICAL IMPRESSION: Better since a recent injection.  Excellent job with there ex that included an increase in resistance in the back ext and ab curl machine.   LONG TERM GOALS: Target date: 08/31/2021  Ind with a HEP. Baseline:  Goal status: INITIAL  2.  Perform ADL's with pain not > 4/10. Baseline:  Goal status: INITIAL  3.  Eliminate LE symptoms. Baseline:  Goal status: INITIAL  4.  Stand 20 minutes with pain not > 4/10. Baseline:  Goal status: INITIAL  5.  Perform transitory movement with a reported 50% greater ease. Baseline:  Goal status: INITIAL    PLAN: PT FREQUENCY: 3x/week  PT DURATION: 4 weeks  PLANNED INTERVENTIONS: Therapeutic exercises, Therapeutic activity, Patient/Family education, Joint mobilization, Dry Needling, Electrical  stimulation, Cryotherapy, Moist heat, and Ultrasound.  PLAN FOR NEXT SESSION: Modalites and STW/M.  Slow progression into core exercise progression.   Kincaid Tiger, [place], PT 08/15/2021, 9:12 AM

## 2021-08-18 ENCOUNTER — Ambulatory Visit: Payer: Medicare HMO

## 2021-08-18 DIAGNOSIS — M5459 Other low back pain: Secondary | ICD-10-CM | POA: Diagnosis not present

## 2021-08-18 DIAGNOSIS — R293 Abnormal posture: Secondary | ICD-10-CM

## 2021-08-18 NOTE — Therapy (Signed)
OUTPATIENT PHYSICAL THERAPY THORACOLUMBAR TREATMENT   Patient Name: Peter [name] MRN: [id number] DOB:1950/06/04, 71 y.o., male Today's Date: 08/18/2021    PT End of Session - 08/18/21 0821     Visit Number 7    Number of Visits 12    Date for PT Re-Evaluation 08/16/21    PT Start Time 0815    PT Stop Time 0912    PT Time Calculation (min) 57 min    Activity Tolerance Patient tolerated treatment well    Behavior During Therapy [name] for tasks assessed/performed                Past Medical History:  Diagnosis Date   AAA (abdominal aortic aneurysm) ([name])    AAA (abdominal aortic aneurysm) ([name])    has graft (Dr. at [facility])   [name] (basal cell carcinoma of skin) 11/29/2005   Right Chest (curet and 5FU)   Best vitelliform macular dystrophy    folowed at [name]   Diabetes mellitus without complication ([name])    type 2   Foot pain    GERD (gastroesophageal reflux disease)    History of kidney stones    Macular degeneration    Palpitations    Varicose veins    Past Surgical History:  Procedure Laterality Date   ABDOMINAL AORTIC ANEURYSM REPAIR  2008   ENDOVENOUS ABLATION SAPHENOUS VEIN W/ LASER  05-24-2011   left greater saphenous vein and stab phlebectomy left leg >20 incisions   ENDOVENOUS ABLATION SAPHENOUS VEIN W/ LASER  06-07-2011   left small saphenous Peter Jews MD   LUMBAR LAMINECTOMY/DECOMPRESSION MICRODISCECTOMY N/A 01/09/2017   Procedure: Microlumbar decompression L4-5;  Surgeon: Peter Day, MD;  Location: [name];  Service: Orthopedics;  Laterality: N/A;   mastoid reconstruction     right 1972 [name]   ROTATOR CUFF REPAIR     TYMPANOPLASTY     bil   Patient Active Problem List   Diagnosis Date Noted   Lumbar spinal stenosis 01/09/2017   Spinal stenosis at L4-L5 level 01/09/2017   Varicose veins of lower extremities with other complications 46/80/3212   Chronic venous insufficiency 12/15/2010     REFERRING  PROVIDER: Susa Day MD  REFERRING DIAG: Vertebrogenic low back pain.  Rationale for Evaluation and Treatment Rehabilitation  THERAPY DIAG:  Other low back pain  Abnormal posture  ONSET DATE: One month ago.  SUBJECTIVE:  SUBJECTIVE STATEMENT: Ipt arrives today reporting slightly increased pain.  PERTINENT HISTORY:   Right foot injury, left ankle ORIF, lumbar surgery, Right RTC repair, AAA.  PAIN:  Are you having pain? Yes: NPRS scale: 4/10 Pain location: Across low back and into buttocks and hips. Pain description: Ache, sore, sharp shooting. Aggravating factors: "Any activity." Relieving factors: Pain medication.   TODAY'S TREATMENT                                    08/17/21 EXERCISE LOG  Exercise Repetitions and Resistance Comments  Nustep L4 x 15 minutes    Back ext 60# 4 minutes   Ab curl 80# 4 minutes.            Blank cell = exercise not performed today   Modalities  Date:  Unattended Estim: Lumbar, IFC 80-150 Hz, 20 mins, Pain Hot Pack: Lumbar, 20 mins, Pain and Tone   ASSESSMENT:  CLINICAL IMPRESSION: Pt arrives for today's treatment session reporting 4/10 low back pain.  Pt unaware of what caused increase in back pain.  Due to increased pain today, reviewed previously performed exercises.  Normal responses to estim and MH noted upon removal. Pt reported 2/10 low back pain upon completion of today's treatment session.     LONG TERM GOALS: Target date: 08/31/2021  Ind with a HEP. Baseline:  Goal status: IN PROGRESS  2.  Perform ADL's with pain not > 4/10. Baseline:  Goal status: IN PROGRESS  3.  Eliminate LE symptoms. Baseline:  Goal status: IN PROGRESS  4.  Stand 20 minutes with pain not > 4/10. Baseline:  Goal status: IN PROGRESS  5.  Perform transitory  movement with a reported 50% greater ease. Baseline:  Goal status: IN PROGRESS    PLAN: PT FREQUENCY: 3x/week  PT DURATION: 4 weeks  PLANNED INTERVENTIONS: Therapeutic exercises, Therapeutic activity, Patient/Family education, Joint mobilization, Dry Needling, Electrical stimulation, Cryotherapy, Moist heat, and Ultrasound.  PLAN FOR NEXT SESSION: Modalites and STW/M.  Slow progression into core exercise progression.   Kathrynn Ducking, PTA 08/18/2021, 9:15 AM

## 2021-08-22 ENCOUNTER — Encounter: Payer: Self-pay | Admitting: Physical Therapy

## 2021-08-22 ENCOUNTER — Ambulatory Visit: Payer: Medicare HMO | Admitting: Physical Therapy

## 2021-08-22 DIAGNOSIS — M5459 Other low back pain: Secondary | ICD-10-CM

## 2021-08-22 DIAGNOSIS — R293 Abnormal posture: Secondary | ICD-10-CM

## 2021-08-22 NOTE — Therapy (Signed)
OUTPATIENT PHYSICAL THERAPY THORACOLUMBAR TREATMENT   Patient Name: Peter [name] MRN: [id number] DOB:Jan 17, 1950, 71 y.o., male Today's Date: 08/22/2021    PT End of Session - 08/22/21 1426     Visit Number 8    Number of Visits 12    Date for PT Re-Evaluation 08/16/21    PT Start Time 0145    Activity Tolerance Patient tolerated treatment well    Behavior During Therapy [facility] for tasks assessed/performed                Past Medical History:  Diagnosis Date   AAA (abdominal aortic aneurysm) ([place])    AAA (abdominal aortic aneurysm) ([place])    has graft (Dr. at [facility])   [place] (basal cell carcinoma of skin) 11/29/2005   Right Chest (curet and 5FU)   Best vitelliform macular dystrophy    folowed at [facility]   Diabetes mellitus without complication ([place])    type 2   Foot pain    GERD (gastroesophageal reflux disease)    History of kidney stones    Macular degeneration    Palpitations    Varicose veins    Past Surgical History:  Procedure Laterality Date   ABDOMINAL AORTIC ANEURYSM REPAIR  2008   ENDOVENOUS ABLATION SAPHENOUS VEIN W/ LASER  05-24-2011   left greater saphenous vein and stab phlebectomy left leg >20 incisions   ENDOVENOUS ABLATION SAPHENOUS VEIN W/ LASER  06-07-2011   left small saphenous Curt Jews MD   LUMBAR LAMINECTOMY/DECOMPRESSION MICRODISCECTOMY N/A 01/09/2017   Procedure: Microlumbar decompression L4-5;  Surgeon: Susa Day, MD;  Location: [facility];  Service: Orthopedics;  Laterality: N/A;   mastoid reconstruction     right 1972 Dr. Rock Nephew   ROTATOR CUFF REPAIR     TYMPANOPLASTY     bil   Patient Active Problem List   Diagnosis Date Noted   Lumbar spinal stenosis 01/09/2017   Spinal stenosis at L4-L5 level 01/09/2017   Varicose veins of lower extremities with other complications 63/89/3734   Chronic venous insufficiency 12/15/2010     REFERRING PROVIDER: Susa Day MD  REFERRING DIAG: Vertebrogenic low  back pain.  Rationale for Evaluation and Treatment Rehabilitation  THERAPY DIAG:  Other low back pain  Abnormal posture  ONSET DATE: One month ago.  SUBJECTIVE:                                                                                                                                                                                           SUBJECTIVE STATEMENT: Increased pain today. PERTINENT HISTORY:   Right foot  injury, left ankle ORIF, lumbar surgery, Right RTC repair, AAA.  PAIN:  Are you having pain? Yes: NPRS scale:  /10 Pain location: Across low back and into buttocks and hips. Pain description: Ache, sore, sharp shooting. Aggravating factors: "Any activity." Relieving factors: Pain medication.   TODAY'S TREATMENT                                    08/17/21 EXERCISE LOG  Exercise Repetitions and Resistance Comments  Nustep L4 x 13 minutes                     Blank cell = exercise not performed today   Manual:  STW/M x 10 minutes to patient's affected lumbar region.  Modalities  Date:  Unattended Estim: Lumbar, IFC 80-150 Hz, 20 mins, Pain Hot Pack: Lumbar, 20 mins, Pain and Tone   ASSESSMENT:  CLINICAL IMPRESSION: Pt arrives for today's treatment session reporting 8/10 low back pain.  Patient responded well to treatment and stated he felt better afterwards.  Ind with a HEP. Baseline:  Goal status: IN PROGRESS  2.  Perform ADL's with pain not > 4/10. Baseline:  Goal status: IN PROGRESS  3.  Eliminate LE symptoms. Baseline:  Goal status: IN PROGRESS  4.  Stand 20 minutes with pain not > 4/10. Baseline:  Goal status: IN PROGRESS  5.  Perform transitory movement with a reported 50% greater ease. Baseline:  Goal status: IN PROGRESS    PLAN: PT FREQUENCY: 3x/week  PT DURATION: 4 weeks  PLANNED INTERVENTIONS: Therapeutic exercises, Therapeutic activity, Patient/Family education, Joint mobilization, Dry Needling, Electrical stimulation,  Cryotherapy, Moist heat, and Ultrasound.  PLAN FOR NEXT SESSION: Modalites and STW/M.  Slow progression into core exercise progression.   Richardine Peppers, [place], PT 08/22/2021, 2:27 PM

## 2021-08-25 ENCOUNTER — Other Ambulatory Visit: Payer: Self-pay

## 2021-08-25 ENCOUNTER — Emergency Department (HOSPITAL_COMMUNITY)
Admission: EM | Admit: 2021-08-25 | Discharge: 2021-08-25 | Disposition: A | Discharge status: Home / Self Care | Payer: Medicare HMO | Attending: Emergency Medicine | Admitting: Emergency Medicine

## 2021-08-25 ENCOUNTER — Encounter (HOSPITAL_COMMUNITY): Payer: Self-pay | Admitting: Emergency Medicine

## 2021-08-25 DIAGNOSIS — R531 Weakness: Secondary | ICD-10-CM

## 2021-08-25 DIAGNOSIS — M6281 Muscle weakness (generalized): Secondary | ICD-10-CM | POA: Diagnosis not present

## 2021-08-25 DIAGNOSIS — E119 Type 2 diabetes mellitus without complications: Secondary | ICD-10-CM | POA: Insufficient documentation

## 2021-08-25 DIAGNOSIS — Z79899 Other long term (current) drug therapy: Secondary | ICD-10-CM | POA: Insufficient documentation

## 2021-08-25 DIAGNOSIS — E860 Dehydration: Secondary | ICD-10-CM | POA: Insufficient documentation

## 2021-08-25 LAB — BASIC METABOLIC PANEL
Anion gap: 10 (ref 5–15)
BUN: 21 mg/dL (ref 8–23)
CO2: 26 mmol/L (ref 22–32)
Calcium: 9.5 mg/dL (ref 8.9–10.3)
Chloride: 97 mmol/L — ABNORMAL LOW (ref 98–111)
Creatinine, Ser: 1.29 mg/dL — ABNORMAL HIGH (ref 0.61–1.24)
GFR, Estimated: 59 mL/min — ABNORMAL LOW (ref >60)
Glucose, Bld: 132 mg/dL — ABNORMAL HIGH (ref 70–99)
Potassium: 4.2 mmol/L (ref 3.5–5.1)
Sodium: 133 mmol/L — ABNORMAL LOW (ref 135–145)

## 2021-08-25 LAB — HEPATIC FUNCTION PANEL
ALT: 30 U/L (ref 0–44)
AST: 25 U/L (ref 15–41)
Albumin: 4.1 g/dL (ref 3.5–5.0)
Alkaline Phosphatase: 84 U/L (ref 38–126)
Bilirubin, Direct: 0.2 mg/dL (ref 0.0–0.2)
Indirect Bilirubin: 0.9 mg/dL (ref 0.3–0.9)
Total Bilirubin: 1.1 mg/dL (ref 0.3–1.2)
Total Protein: 8 g/dL (ref 6.5–8.1)

## 2021-08-25 LAB — URINALYSIS, ROUTINE W REFLEX MICROSCOPIC
Bilirubin Urine: NEGATIVE
Glucose, UA: NEGATIVE mg/dL
Hgb urine dipstick: NEGATIVE
Ketones, ur: 20 mg/dL — AB
Leukocytes,Ua: NEGATIVE
Nitrite: NEGATIVE
Protein, ur: NEGATIVE mg/dL
Specific Gravity, Urine: 1.017 (ref 1.005–1.030)
pH: 5 (ref 5.0–8.0)

## 2021-08-25 LAB — PHOSPHORUS: Phosphorus: 3.8 mg/dL (ref 2.5–4.6)

## 2021-08-25 LAB — CBC
HCT: 53.8 % — ABNORMAL HIGH (ref 39.0–52.0)
Hemoglobin: 17 g/dL (ref 13.0–17.0)
MCH: 24.8 pg — ABNORMAL LOW (ref 26.0–34.0)
MCHC: 31.6 g/dL (ref 30.0–36.0)
MCV: 78.4 fL — ABNORMAL LOW (ref 80.0–100.0)
Platelets: 284 10*3/uL (ref 150–400)
RBC: 6.86 MIL/uL — ABNORMAL HIGH (ref 4.22–5.81)
RDW: 17.5 % — ABNORMAL HIGH (ref 11.5–15.5)
WBC: 10.8 10*3/uL — ABNORMAL HIGH (ref 4.0–10.5)
nRBC: 0 % (ref 0.0–0.2)

## 2021-08-25 LAB — MAGNESIUM: Magnesium: 1.9 mg/dL (ref 1.7–2.4)

## 2021-08-25 MED ORDER — IBUPROFEN 400 MG PO TABS
400.0000 mg | ORAL_TABLET | Freq: Once | ORAL | Status: AC
  Administered 2021-08-25: 400 mg via ORAL
  Filled 2021-08-25: qty 1

## 2021-08-25 MED ORDER — SODIUM CHLORIDE 0.9 % IV BOLUS
1000.0000 mL | Freq: Once | INTRAVENOUS | Status: AC
  Administered 2021-08-25: 1000 mL via INTRAVENOUS
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
## 2021-08-25 NOTE — Discharge Instructions (Addendum)
You were seen in the emergency department for your generalized weakness.  You had no signs on your exam concerning for a spinal cord injury.  Had labs performed that showed that you are mildly dehydrated in your urine but your kidney function and your electrolytes were normal and your hemoglobin was normal.  You should follow-up for your outpatient MRI as planned and you should follow-up with your primary doctor to have your symptoms rechecked.  You should continue to work with physical therapy.  You should return to the emergency department if your pain gets significantly worse, you are unable to walk, you have numbness in your groin, you are unable to urinate, you pass out or if you have any other new or concerning symptoms.

## 2021-08-29 ENCOUNTER — Ambulatory Visit: Payer: Medicare HMO

## 2021-08-29 DIAGNOSIS — R293 Abnormal posture: Secondary | ICD-10-CM

## 2021-08-29 DIAGNOSIS — M5459 Other low back pain: Secondary | ICD-10-CM | POA: Diagnosis not present
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
## 2021-09-01 ENCOUNTER — Encounter: Payer: Self-pay | Admitting: *Deleted

## 2021-09-01 ENCOUNTER — Ambulatory Visit: Payer: Medicare HMO | Admitting: *Deleted

## 2021-09-01 DIAGNOSIS — R293 Abnormal posture: Secondary | ICD-10-CM

## 2021-09-01 DIAGNOSIS — M5459 Other low back pain: Secondary | ICD-10-CM | POA: Diagnosis not present

## 2021-09-01 NOTE — Therapy (Signed)
OUTPATIENT PHYSICAL THERAPY THORACOLUMBAR TREATMENT   Patient Name: Peter [name] MRN: [id number] DOB:08-28-1950, 71 y.o., male Today's Date: 09/01/2021    PT End of Session - 09/01/21 0832     Visit Number 10    Number of Visits 12    Date for PT Re-Evaluation 08/16/21    PT Start Time 0815    PT Stop Time 0904    PT Time Calculation (min) 49 min                Past Medical History:  Diagnosis Date   AAA (abdominal aortic aneurysm) ([place])    AAA (abdominal aortic aneurysm) ([place])    has graft (Dr. at [facility])   [place] (basal cell carcinoma of skin) 11/29/2005   Right Chest (curet and 5FU)   Best vitelliform macular dystrophy    folowed at [facility]   Diabetes mellitus without complication ([place])    type 2   Foot pain    GERD (gastroesophageal reflux disease)    History of kidney stones    Macular degeneration    Palpitations    Varicose veins    Past Surgical History:  Procedure Laterality Date   ABDOMINAL AORTIC ANEURYSM REPAIR  2008   ENDOVENOUS ABLATION SAPHENOUS VEIN W/ LASER  05-24-2011   left greater saphenous vein and stab phlebectomy left leg >20 incisions   ENDOVENOUS ABLATION SAPHENOUS VEIN W/ LASER  06-07-2011   left small saphenous Curt Jews MD   LUMBAR LAMINECTOMY/DECOMPRESSION MICRODISCECTOMY N/A 01/09/2017   Procedure: Microlumbar decompression L4-5;  Surgeon: Susa Day, MD;  Location: [facility];  Service: Orthopedics;  Laterality: N/A;   mastoid reconstruction     right 1972 Dr. Rock Nephew   ROTATOR CUFF REPAIR     TYMPANOPLASTY     bil   Patient Active Problem List   Diagnosis Date Noted   Lumbar spinal stenosis 01/09/2017   Spinal stenosis at L4-L5 level 01/09/2017   Varicose veins of lower extremities with other complications 18/29/9371   Chronic venous insufficiency 12/15/2010     REFERRING PROVIDER: Susa Day MD  REFERRING DIAG: Vertebrogenic low back pain.  Rationale for Evaluation and Treatment  Rehabilitation  THERAPY DIAG:  Other low back pain  Abnormal posture  ONSET DATE: One month ago.  SUBJECTIVE:                                                                                                                                                                                           SUBJECTIVE STATEMENT:  Pt reports constant  mild low back pain.  PERTINENT HISTORY:  Right foot injury, left ankle ORIF, lumbar surgery, Right RTC repair, AAA.  PAIN:  Are you having pain? Yes: NPRS scale: 3/10 Pain location: Across low back and into buttocks and hips. Pain description: Ache, sore, sharp shooting. Aggravating factors: "Any activity." Relieving factors: Pain medication.   TODAY'S TREATMENT                                    09/01/21 EXERCISE LOG  Exercise Repetitions and Resistance Comments  Nustep L4 x 14 minutes    Lumbar ext    Lumbar flexion    Cybex knee flexion    Cybex knee extension    Cybex Leg Press     Blank cell = exercise not performed today                Standing modified bird-dog at counter: Leg raise 2x6 each side with 5 sec hold               Draw-in TA mm activation 2x10 hold 5 secs               Draw-in with leg lift in sitting 2x6 each side hold 5 secs each                SOC 2x10 for spinal mobility and posture awareness. Find neutral position                  HEP handout given with good demonstration of above exs performed.    Modalities    ASSESSMENT:  CLINICAL IMPRESSION: Pt arrives for today's treatment session reporting mild constant  low back pain. Rx focused on posture awareness, spinal mobilty, as well as mm activation for TA and multifidus. Handout given for HEP with good demonstration. Review next visit Baseline:  Goal status: IN PROGRESS  2.  Perform ADL's with pain not > 4/10. Baseline:  Goal status: IN PROGRESS  3.  Eliminate LE symptoms. Baseline:  Goal status: IN PROGRESS  4.  Stand 20 minutes with pain not >  4/10. Baseline:  Goal status: IN PROGRESS  5.  Perform transitory movement with a reported 50% greater ease. Baseline:  Goal status: IN PROGRESS    PLAN: PT FREQUENCY: 3x/week  PT DURATION: 4 weeks  PLANNED INTERVENTIONS: Therapeutic exercises, Therapeutic activity, Patient/Family education, Joint mobilization, Dry Needling, Electrical stimulation, Cryotherapy, Moist heat, and Ultrasound.  PLAN FOR NEXT SESSION: Modalites and STW/M.  Slow progression into core exercise progression.   Riata Ikeda,CHRIS, PTA 09/01/2021, 12:26 PM

## 2021-09-05 ENCOUNTER — Encounter: Payer: Self-pay | Admitting: *Deleted

## 2021-09-05 ENCOUNTER — Ambulatory Visit: Payer: Medicare HMO | Admitting: *Deleted

## 2021-09-05 DIAGNOSIS — M5459 Other low back pain: Secondary | ICD-10-CM | POA: Diagnosis not present

## 2021-09-05 DIAGNOSIS — R293 Abnormal posture: Secondary | ICD-10-CM

## 2021-09-05 NOTE — Therapy (Signed)
OUTPATIENT PHYSICAL THERAPY THORACOLUMBAR TREATMENT   Patient Name: Peter [name] MRN: [id number] DOB:[birth year], 71 y.o., male Today's Date: 09/05/2021    PT End of Session - 09/05/21 0921     Visit Number 11    Number of Visits 12    Date for PT Re-Evaluation 08/16/21    PT Start Time 0900    PT Stop Time 0950    PT Time Calculation (min) 50 min                Past Medical History:  Diagnosis Date   AAA (abdominal aortic aneurysm) ([place])    AAA (abdominal aortic aneurysm) ([place])    has graft (Dr. at [facility])   [place] (basal cell carcinoma of skin) 11/29/2005   Right Chest (curet and 5FU)   Best vitelliform macular dystrophy    folowed at [facility]   Diabetes mellitus without complication ([place])    type 2   Foot pain    GERD (gastroesophageal reflux disease)    History of kidney stones    Macular degeneration    Palpitations    Varicose veins    Past Surgical History:  Procedure Laterality Date   ABDOMINAL AORTIC ANEURYSM REPAIR  2008   ENDOVENOUS ABLATION SAPHENOUS VEIN W/ LASER  05-24-2011   left greater saphenous vein and stab phlebectomy left leg >20 incisions   ENDOVENOUS ABLATION SAPHENOUS VEIN W/ LASER  06-07-2011   left small saphenous Curt Jews MD   LUMBAR LAMINECTOMY/DECOMPRESSION MICRODISCECTOMY N/A 01/09/2017   Procedure: Microlumbar decompression L4-5;  Surgeon: Susa Day, MD;  Location: [facility];  Service: Orthopedics;  Laterality: N/A;   mastoid reconstruction     right 1972 Dr. Rock Nephew   ROTATOR CUFF REPAIR     TYMPANOPLASTY     bil   Patient Active Problem List   Diagnosis Date Noted   Lumbar spinal stenosis 01/09/2017   Spinal stenosis at L4-L5 level 01/09/2017   Varicose veins of lower extremities with other complications 33/82/5053   Chronic venous insufficiency 12/15/2010     REFERRING PROVIDER: Susa Day MD  REFERRING DIAG: Vertebrogenic low back pain.  Rationale for Evaluation and Treatment  Rehabilitation  THERAPY DIAG:  Other low back pain  Abnormal posture  ONSET DATE: One month ago.  SUBJECTIVE:                                                                                                                                                                                           SUBJECTIVE STATEMENT:  Pt reports constant  mild low back pain.  PERTINENT HISTORY:  Right foot injury, left ankle ORIF, lumbar surgery, Right RTC repair, AAA.  PAIN:  Are you having pain? Yes: NPRS scale: 3/10 Pain location: Across low back and into buttocks and hips. Pain description: Ache, sore, sharp shooting. Aggravating factors: "Any activity." Relieving factors: Pain medication.   TODAY'S TREATMENT                                    09/05/21 EXERCISE LOG  Exercise Repetitions and Resistance Comments  Nustep L4 x 15 minutes    Lumbar ext    Lumbar flexion    Cybex knee flexion    Cybex knee extension    Cybex Leg Press     Blank cell = exercise not performed today                Standing modified bird-dog at counter: Leg raise 2x6 each side with 5 sec hold verbal and tactile cues               Draw-in TA mm activation 2x10 hold 5 secs, also performed in hooklying x 10 hold 5 secs               Draw-in with leg lift in sitting 2x6 each side hold 5 secs each                SOC 2x10 for spinal mobility and posture awareness. Find neutral position                     Modalities   HFC x 15 mins 80-'150hz'$  to LB hooklying    ASSESSMENT:  CLINICAL IMPRESSION: Pt arrived today doing about the same with LB pain levels. Rx focused on core activation Exs and strengthening. Verbal and tactile cues needed for technique. HFC end of session.      Baseline:  Goal status: IN PROGRESS  2.  Perform ADL's with pain not > 4/10. Baseline:  Goal status: IN PROGRESS  3.  Eliminate LE symptoms. Baseline:  Goal status: IN PROGRESS  4.  Stand 20 minutes with pain not >  4/10. Baseline:  Goal status: IN PROGRESS  5.  Perform transitory movement with a reported 50% greater ease. Baseline:  Goal status: IN PROGRESS    PLAN: PT FREQUENCY: 3x/week  PT DURATION: 4 weeks  PLANNED INTERVENTIONS: Therapeutic exercises, Therapeutic activity, Patient/Family education, Joint mobilization, Dry Needling, Electrical stimulation, Cryotherapy, Moist heat, and Ultrasound.  PLAN FOR NEXT SESSION: Modalites and STW/M.  Slow progression into core exercise progression.   Ersilia Brawley,CHRIS, PTA 09/05/2021, 10:15 AM

## 2021-09-07 ENCOUNTER — Encounter: Payer: Self-pay | Admitting: *Deleted

## 2021-09-07 ENCOUNTER — Ambulatory Visit: Payer: Medicare HMO | Admitting: *Deleted

## 2021-09-07 DIAGNOSIS — M5459 Other low back pain: Secondary | ICD-10-CM

## 2021-09-07 DIAGNOSIS — R293 Abnormal posture: Secondary | ICD-10-CM

## 2021-09-07 NOTE — Therapy (Signed)
OUTPATIENT PHYSICAL THERAPY THORACOLUMBAR TREATMENT   Patient Name: Peter [name] MRN: [id number] DOB:[birth year], 71 y.o., male Today's Date: 09/07/2021    PT End of Session - 09/07/21 0821     Visit Number 12    Number of Visits 12    Date for PT Re-Evaluation 08/16/21    PT Start Time 0818    PT Stop Time 0905    PT Time Calculation (min) 47 min                Past Medical History:  Diagnosis Date   AAA (abdominal aortic aneurysm) ([place])    AAA (abdominal aortic aneurysm) ([place])    has graft (Dr. at [facility])   [place] (basal cell carcinoma of skin) 11/29/2005   Right Chest (curet and 5FU)   Best vitelliform macular dystrophy    folowed at [facility]   Diabetes mellitus without complication ([place])    type 2   Foot pain    GERD (gastroesophageal reflux disease)    History of kidney stones    Macular degeneration    Palpitations    Varicose veins    Past Surgical History:  Procedure Laterality Date   ABDOMINAL AORTIC ANEURYSM REPAIR  2008   ENDOVENOUS ABLATION SAPHENOUS VEIN W/ LASER  05-24-2011   left greater saphenous vein and stab phlebectomy left leg >20 incisions   ENDOVENOUS ABLATION SAPHENOUS VEIN W/ LASER  06-07-2011   left small saphenous Curt Jews MD   LUMBAR LAMINECTOMY/DECOMPRESSION MICRODISCECTOMY N/A 01/09/2017   Procedure: Microlumbar decompression L4-5;  Surgeon: Susa Day, MD;  Location: [facility];  Service: Orthopedics;  Laterality: N/A;   mastoid reconstruction     right 1972 Dr. Rock Nephew   ROTATOR CUFF REPAIR     TYMPANOPLASTY     bil   Patient Active Problem List   Diagnosis Date Noted   Lumbar spinal stenosis 01/09/2017   Spinal stenosis at L4-L5 level 01/09/2017   Varicose veins of lower extremities with other complications 46/56/8127   Chronic venous insufficiency 12/15/2010     REFERRING PROVIDER: Susa Day MD  REFERRING DIAG: Vertebrogenic low back pain.  Rationale for Evaluation and Treatment  Rehabilitation  THERAPY DIAG:  Other low back pain  Abnormal posture  ONSET DATE: One month ago.  SUBJECTIVE:                                                                                                                                                                                           SUBJECTIVE STATEMENT:  Pt reports constant  mild low back pain.LBP 3-4/10. On hold  please. Want to see MD  PERTINENT HISTORY:   Right foot injury, left ankle ORIF, lumbar surgery, Right RTC repair, AAA.  PAIN:  Are you having pain? Yes: NPRS scale: 3-4/10 Pain location: Across low back and into buttocks and hips. Pain description: Ache, sore, sharp shooting. Aggravating factors: "Any activity." Relieving factors: Pain medication.   TODAY'S TREATMENT                                    09/07/21 EXERCISE LOG  Exercise Repetitions and Resistance Comments  Nustep L4 x 15 minutes    Lumbar ext    Lumbar flexion    Cybex knee flexion    Cybex knee extension    Cybex Leg Press     Blank cell = exercise not performed today                Standing modified bird-dog at counter: Leg raise 2x6 each side with 5-10 sec hold verbal and tactile cues               Draw-in TA mm activation 2x10 hold 5 secs               Draw-in with leg lift in sitting 2x6 each side hold 5-10 secs each                SOC 2x10 for spinal mobility and posture awareness. Find neutral position                     Modalities   IFC x 15 mins 80-150hz  to LB hooklying with HMP    ASSESSMENT:  CLINICAL IMPRESSION: Pt arrived today doing about the same with LB pain levels, but does report Therex is helping decrease pain with functional activities. Rx focused on core activation Exs and strengthening as well as proprioception for HEP. WE discussed exercise progressions IFC end of session.Put on hold until MD F/U as per Pt request.    1.Ind with a HEP. Baseline:  Goal status: MET  Baseline:  Goal status: MET 2.   Perform ADL's with pain not > 4/10. Baseline:  Goal status: Not Met  3.  Eliminate LE symptoms. Baseline:  Goal status: Partially Met 50% less  4.  Stand 20 minutes with pain not > 4/10. Baseline:  Goal status:  Not MET due to pain >4/10   5.  Perform transitory movement with a reported 50% greater ease. Baseline:  Goal status: Partially Met 25% better    PLAN: PT FREQUENCY: 3x/week  PT DURATION: 4 weeks  PLANNED INTERVENTIONS: Therapeutic exercises, Therapeutic activity, Patient/Family education, Joint mobilization, Dry Needling, Electrical stimulation, Cryotherapy, Moist heat, and Ultrasound.  PLAN FOR NEXT SESSION: On hold until MD f/u. Send MD progress note   Peter [name],CHRIS, PTA 09/07/2021, 9:05 AM

## 2021-10-16 ENCOUNTER — Encounter: Payer: Self-pay | Admitting: Surgery

## 2021-10-16 ENCOUNTER — Ambulatory Visit: Payer: Medicare HMO | Admitting: Surgery

## 2021-10-16 VITALS — BP 126/71 | HR 62 | Temp 98.2°F | Resp 20 | Ht 71.5 in | Wt 205.2 lb

## 2021-10-16 DIAGNOSIS — I7143 Infrarenal abdominal aortic aneurysm, without rupture: Secondary | ICD-10-CM | POA: Diagnosis not present
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
## 2022-01-23 ENCOUNTER — Encounter: Payer: Self-pay | Admitting: Cardiology

## 2022-01-23 ENCOUNTER — Ambulatory Visit: Payer: Medicare HMO | Attending: Cardiology | Admitting: Cardiology

## 2022-01-23 VITALS — BP 118/80 | HR 78 | Ht 73.0 in | Wt 212.4 lb

## 2022-01-23 DIAGNOSIS — I2510 Atherosclerotic heart disease of native coronary artery without angina pectoris: Secondary | ICD-10-CM | POA: Insufficient documentation

## 2022-01-23 DIAGNOSIS — E1169 Type 2 diabetes mellitus with other specified complication: Secondary | ICD-10-CM | POA: Diagnosis not present

## 2022-01-23 DIAGNOSIS — T466X5A Adverse effect of antihyperlipidemic and antiarteriosclerotic drugs, initial encounter: Secondary | ICD-10-CM | POA: Insufficient documentation

## 2022-01-23 DIAGNOSIS — G720 Drug-induced myopathy: Secondary | ICD-10-CM

## 2022-01-23 DIAGNOSIS — E785 Hyperlipidemia, unspecified: Secondary | ICD-10-CM

## 2022-01-23 DIAGNOSIS — M48061 Spinal stenosis, lumbar region without neurogenic claudication: Secondary | ICD-10-CM

## 2022-01-23 DIAGNOSIS — I25118 Atherosclerotic heart disease of native coronary artery with other forms of angina pectoris: Secondary | ICD-10-CM

## 2022-01-23 DIAGNOSIS — R072 Precordial pain: Secondary | ICD-10-CM | POA: Diagnosis not present

## 2022-01-23 DIAGNOSIS — I25119 Atherosclerotic heart disease of native coronary artery with unspecified angina pectoris: Secondary | ICD-10-CM | POA: Insufficient documentation

## 2022-01-23 DIAGNOSIS — I10 Essential (primary) hypertension: Secondary | ICD-10-CM

## 2022-01-23 DIAGNOSIS — I872 Venous insufficiency (chronic) (peripheral): Secondary | ICD-10-CM

## 2022-01-23 MED ORDER — CARVEDILOL 6.25 MG PO TABS: 6.2500 mg | ORAL_TABLET | Freq: Two times a day (BID) | ORAL | 3 refills | Status: DC

## 2022-01-23 MED ORDER — METOPROLOL TARTRATE 100 MG PO TABS: 100.0000 mg | ORAL_TABLET | Freq: Once | ORAL | 0 refills | Status: DC

## 2022-01-23 NOTE — Patient Instructions (Addendum)
Medication Instructions:    Start taking Carvedilol 6.25 mg  twice a day  for the first 8 days take 1/2 tablet twice a day   along with decreasing Clonidine to 1/2 tablet  twice a daily  after the 8 days the stop taking Clonidine  totally  See instruction below  *If you need a refill on your cardiac medications before your next appointment, please call your pharmacy*   Lab Work:fasting Cmp lipid If you have labs (blood work) drawn today and your tests are completely normal, you will receive your results only by: [place] (if you have MyChart) OR A paper copy in the mail If you have any lab test that is abnormal or we need to change your treatment, we will call you to review the results.   Testing/Procedures:will be done at [address]  [place] has requested that you have coronary  CTA. Coronary computed tomography (CT)angiogram  is a special type of CT scan that uses a computer to produce multi-dimensional views of major blood vessels throughout the heart.  CT angiography, a contrast material is injected through an IV to help visualize the blood vessels  a painless test that uses an x-ray machine to take clear, detailed pictures of your heart arteries .  Please follow instruction sheet as given.    Follow-Up: At [facility], you and your health needs are our priority.  As part of our continuing mission to provide you with exceptional heart care, we have created designated Provider Care Teams.  These Care Teams include your primary Cardiologist (physician) and Advanced Practice Providers (APPs -  Physician Assistants and Nurse Practitioners) who all work together to provide you with the care you need, when you need it.     Your next appointment:   3 month(s)  The format for your next appointment:   In Person  Provider:   Glenetta Hew, MD  You have been referred to  [facility]- lipid - [facility]  - cholesterol    Other Instructions    Your  cardiac CT will be scheduled at  the below location:   [facility] [place], [place] [address] [phone]    Please arrive at the [facility] En[facility] (Entrance C2) of [facility] 30 minutes prior to test start time. You can use the FREE valet parking offered at entrance C (encouraged to control the heart rate for the test)  Proceed to the [facility] Radiology Department (first floor) to check-in and test prep.  All radiology patients and guests should use entrance C2 at [facility], accessed from [facility], even though the hospital's physical address listed is [address].       Please follow these instructions carefully (unless otherwise directed):  Hold all erectile dysfunction medications at least 3 days (72 hrs) prior to test. (Ie viagra, cialis, sildenafil, tadalafil, etc) We will administer nitroglycerin during this exam.    Do labs at least week prior to test   On the Night Before the Test: Be sure to Drink plenty of water. Do not consume any caffeinated/decaffeinated beverages or chocolate 12 hours prior to your test. Do not take any antihistamines 12 hours prior to your test.  On the Day of the Test: Drink plenty of water until 1 hour prior to the test. Do not eat any food 1 hour prior to test. You may take your regular medications prior to the test.  Take metoprolol (Lopressor)  100 mg two hours prior to test. ( Per Dr Ellyn Hack take 50 mg  dose - 1/2 tablet) in addition to carvedilol)        After the Test: Drink plenty of water. After receiving IV contrast, you may experience a mild flushed feeling. This is normal. On occasion, you may experience a mild rash up to 24 hours after the test. This is not dangerous. If this occurs, you can take Benadryl 25 mg and increase your fluid intake. If you experience trouble breathing, this can be serious. If it is severe call 911 IMMEDIATELY. If  it is mild, please call our office.   We will call to schedule your test 2-4 weeks out understanding that some insurance companies will need an authorization prior to the service being performed.   For non-scheduling related questions, please contact the cardiac imaging nurse navigator should you have any questions/concerns: Marchia Bond, Cardiac Imaging Nurse Navigator Gordy Clement, Cardiac Imaging Nurse Navigator [facility] Direct Office Dial: [phone]   For scheduling needs, including cancellations and rescheduling, please call [place], [phone].
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
## 2022-01-27 ENCOUNTER — Encounter: Payer: Self-pay | Admitting: Cardiology

## 2022-01-27 NOTE — Assessment & Plan Note (Signed)
Lipids are adequate control especially with Coronary Calcium Score > of1000. Target LDL should be less than 70.  Has been intolerant of at least treatment of statins. Will recheck lipids and refer to CVRR lipid clinic.  May require PCSK9 inhibitors or potentially inclisiran.

## 2022-01-27 NOTE — Assessment & Plan Note (Signed)
BP not that bad today, but I would prefer to avoid using clonidine as it makes him feel somewhat fatigued.  Plan: Convert clonidine to carvedilol as noted: Rx for 6.25 mg twice daily carvedilol -> 1st 8 d take 1/2 tablet twice daily with 1/2 tablet twice daily clonidine -> at day 9, DC clonidine and increase to full 6.25 mg twice daily carvedilol. Using beta-blocker because of evidence of CAD Continue olmesartan which I think he is taking twice daily. If additional control required, would then consider diuretic.

## 2022-01-27 NOTE — Assessment & Plan Note (Signed)
Atypical symptoms of chest discomfort, however he is also noted presyncope fatigue.  Coronary Calcium Score is quite high.  With poorly controlled lipids and mechanical symptoms, reasonable to more completely evaluate with Coronary CTA.

## 2022-01-27 NOTE — Assessment & Plan Note (Signed)
Per report, he is certain that he had tried at least 3 if not for statins.  Lipids are not adequately controlled I suspect that he probably need PCSK9 a better.  We are rechecking lipids and referring to CVRR Lipid Plus Hypertension Clinic.  (Cardiovascular Risk Reduction)
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
## 2022-01-27 NOTE — Assessment & Plan Note (Signed)
He has some limited mobility wise and it may not be pushing up on of actually having true exertional symptoms.

## 2022-01-29 LAB — COMPREHENSIVE METABOLIC PANEL
ALT: 22 IU/L (ref 0–44)
AST: 21 IU/L (ref 0–40)
Albumin/Globulin Ratio: 1.8 (ref 1.2–2.2)
Albumin: 4.4 g/dL (ref 3.8–4.8)
Alkaline Phosphatase: 91 IU/L (ref 44–121)
BUN/Creatinine Ratio: 11 (ref 10–24)
BUN: 13 mg/dL (ref 8–27)
Bilirubin Total: 0.8 mg/dL (ref 0.0–1.2)
CO2: 26 mmol/L (ref 20–29)
Calcium: 10.1 mg/dL (ref 8.6–10.2)
Chloride: 100 mmol/L (ref 96–106)
Creatinine, Ser: 1.21 mg/dL (ref 0.76–1.27)
Globulin, Total: 2.4 g/dL (ref 1.5–4.5)
Glucose: 135 mg/dL — ABNORMAL HIGH (ref 70–99)
Potassium: 4.9 mmol/L (ref 3.5–5.2)
Sodium: 139 mmol/L (ref 134–144)
Total Protein: 6.8 g/dL (ref 6.0–8.5)
eGFR: 64 mL/min/{1.73_m2} (ref >59)

## 2022-01-29 LAB — LIPID PANEL
Chol/HDL Ratio: 5.9 ratio — ABNORMAL HIGH (ref 0.0–5.0)
Cholesterol, Total: 189 mg/dL (ref 100–199)
HDL: 32 mg/dL — ABNORMAL LOW (ref >39)
LDL Chol Calc (NIH): 128 mg/dL — ABNORMAL HIGH (ref 0–99)
Triglycerides: 159 mg/dL — ABNORMAL HIGH (ref 0–149)
VLDL Cholesterol Cal: 29 mg/dL (ref 5–40)

## 2022-02-01 ENCOUNTER — Ambulatory Visit: Payer: Medicare HMO | Attending: Internal Medicine | Admitting: Student

## 2022-02-01 VITALS — BP 123/75 | HR 65

## 2022-02-01 DIAGNOSIS — E1169 Type 2 diabetes mellitus with other specified complication: Secondary | ICD-10-CM

## 2022-02-01 DIAGNOSIS — I10 Essential (primary) hypertension: Secondary | ICD-10-CM | POA: Diagnosis not present

## 2022-02-01 DIAGNOSIS — E785 Hyperlipidemia, unspecified: Secondary | ICD-10-CM

## 2022-02-01 NOTE — Patient Instructions (Signed)
Your Results:             Your most recent labs Goal  Total Cholesterol 189 < 200  Triglycerides 159 < 150  HDL (happy/good cholesterol) 32 > 40  LDL (lousy/bad cholesterol 128 < 70   Medication changes: We will start the process to get PCSK9i (Repatha or Praluent) covered by your insurance.  Once the prior authorization is complete, we will call you to let you know and confirm pharmacy information.      Praluent is a cholesterol medication that improved your body's ability to get rid of "bad cholesterol" known as LDL. It can lower your LDL up to 60%. It is an injection that is given under the skin every 2 weeks. The most common side effects of Praluent include runny nose, symptoms of the common cold, rarely flu or flu-like symptoms, back/muscle pain in about 3-4% of the patients, and redness, pain, or bruising at the injection site.    Repatha is a cholesterol medication that improved your body's ability to get rid of "bad cholesterol" known as LDL. It can lower your LDL up to 60%! It is an injection that is given under the skin every 2 weeks. The most common side effects of Repatha include runny nose, symptoms of the common cold, rarely flu or flu-like symptoms, back/muscle pain in about 3-4% of the patients, and redness, pain, or bruising at the injection site.   Lab orders: We want to repeat labs after 2-3 months.  We will send you a lab order to remind you once we get closer to that time.        Copay Assistance:  The [employer] foundation offers assistance to help pay for medication copays.  They will cover copays for all cholesterol lowering meds, including statins, fibrates, omega-3 oils, ezetimibe, Repatha, Praluent, Nexletol, Nexlizet.  The cards are usually good for $2,500 or 12 months, whichever comes first. Go to healthwellfoundation.org Click on "Apply Now" Answer questions as to whom is applying (patient or representative) Your disease fund will be "hypercholesterolemia -  Medicare access" Select the cholesterol medication you need assistance with (Repatha, Praluent, Nexlizet...) They will ask question about qualifying diagnosis - you can mark "yes"; and do you have insurance coverage.   When they ask what type of assistance you are interested in - "copay assistance" When you submit, the approval is usually within minutes.  You will need to print the card information from the site You will need to show this information to your pharmacy, they will bill your Medicare Part D plan first -then bill [employer] --for the copay.   You can also call them at [phone], although the hold times can be quite long.

## 2022-02-01 NOTE — Progress Notes (Signed)
Patient ID: [name]                 DOB: 02-Oct-1950                    MRN: [id number]      HPI: Peter [name] is a 72 y.o. male patient referred to lipid clinic by Dr. Ellyn Hack. PMH is significant for T2DM, CAD, hypertension, HDL    Patient presented with his wife today to discuss his BP medications and cholesterol medications. He checks his BP at home once a day and it averages ~120/91 range and heart rate averages about 60-65 range. He feels funny when his BP is high or low he can not explain the symptoms. He takes his medications regularly and tolerates them well. Doe not have dizziness, palpitation, swelling, chest pain or SOB. He has tried multiple statins over the years and could not tolerate any of them they all causes severe muscle and joint pain. He is not on any cholesterol medications. Heart diseases runs in family. One of his brothers had triple bypass done at age of 67 and his other siblings went through multiple PCI with stents. He tries to eat fairly balanced diet but thinks he could improve.    Diet: does not eat fried food  Bacon and sausage for breakfast- replace those with healthy options oats, eggs.     Family History:  Mother - hypertension, Died at 55 from HF Mother side grandfather died at age of 1 from heart attack One brother- T2DM, heart problems, older brother had triple bypass at age of 50. Sisters- T2DM, Heart problems, multiple MI and Strokes  Social History:  Alcohol: none Smoking: none  Labs: Lipid Panel     Component Value Date/Time   CHOL 189 01/29/2022 0920   TRIG 159 (H) 01/29/2022 0920   HDL 32 (L) 01/29/2022 0920   CHOLHDL 5.9 (H) 01/29/2022 0920   LDLCALC 128 (H) 01/29/2022 0920   LABVLDL 29 01/29/2022 0920    Past Medical History:  Diagnosis Date   AAA (abdominal aortic aneurysm) (HCC)    has graft (Dr. at [facility])   [place] (basal cell carcinoma of skin) 11/29/2005   Right Chest (curet and 5FU)    Best vitelliform macular dystrophy    folowed at [facility]   Chronic low back pain    Diabetes mellitus without complication (HCC)    type 2   Essential hypertension    Foot pain    GERD (gastroesophageal reflux disease)    History of kidney stones    Low testosterone in male    Macular degeneration    Palpitations    Polycythemia    Varicose veins     Current Outpatient Medications on File Prior to Visit  Medication Sig Dispense Refill   acetaminophen (TYLENOL) 500 MG tablet Take 1,000 mg by mouth every 8 (eight) hours as needed for mild pain. (Patient not taking: Reported on 01/23/2022)     ANDROGEL PUMP 20.25 MG/ACT (1.62%) GEL   0   aspirin EC 81 MG tablet Take 81 mg by mouth once.     carvedilol (COREG) 6.25 MG tablet Take 1 tablet (6.25 mg total) by mouth 2 (two) times daily. 180 tablet 3   cloNIDine (CATAPRES) 0.2 MG tablet Take 0.2 mg by mouth 2 (two) times daily.     Coenzyme Q10 200 MG capsule Co Q10     gabapentin (  NEURONTIN) 300 MG capsule Take 300 mg by mouth daily.     meclizine (ANTIVERT) 25 MG tablet Take 25 mg by mouth.     metoCLOPramide (REGLAN) 10 MG tablet Take 10 mg by mouth 4 (four) times daily.     metoprolol tartrate (LOPRESSOR) 100 MG tablet Take 1 tablet (100 mg total) by mouth once for 1 dose. TAKE TWO HOURS PRIOR TO  SCHEDULE CARDIAC TEST 1 tablet 0   olmesartan (BENICAR) 20 MG tablet Take 20 mg by mouth daily.     pantoprazole (PROTONIX) 40 MG tablet Take 40 mg by mouth daily.     pyridoxine (B-6) 100 MG tablet Take 100 mg by mouth daily.     Semaglutide, 1 MG/DOSE, (OZEMPIC, 1 MG/DOSE,) 2 MG/1.5ML SOPN '1mg'$      Testosterone 10 MG/ACT (2%) GEL testosterone 10 mg/0.5 gram/actuation transdermal gel pump  APPLY 3 PUMPS TOPICALLY ONCE DAILY IN THE MORNING TO THE THIGHS AS DIRECTED     No current facility-administered medications on file prior to visit.    Allergies  Allergen Reactions   Penicillins Other (See Comments)    Has patient had a PCN reaction  causing immediate rash, facial/tongue/throat swelling, SOB or lightheadedness with hypotension: No Has patient had a PCN reaction causing severe rash involving mucus membranes or skin necrosis: No Has patient had a PCN reaction that required hospitalization: No Has patient had a PCN reaction occurring within the last 10 years: No If all of the above answers are "NO", then may proceed with Cephalosporin use.    Statins Other (See Comments)    Significant limiting myalgias and arthralgias. ->  Has tried atorvastatin, simvastatin, rosuvastatin and he is not sure about pravastatin versus lovastatin.    Assessment/Plan:  1. Hyperlipidemia -  Problem  Hyperlipidemia Associated With Type 2 Diabetes Mellitus (Hcc)   Current Medications: none Intolerances: pitavastatin 1 mg , simvastatin ,atorvastatin , muscle aches weakness, joint pain Risk Factors: CAD, angina,T2DM, hypertension,strong family history of premature CAD/MI, elevated LDLc LDL goal: <70 mg/dl  Last LDLc :128 (01/29/2022)   Essential Hypertension   Hyperlipidemia associated with type 2 diabetes mellitus (HCC) Assessment:  LDL goal: < 70 mg/dl  last LDLc 128 mg/dl (01/29/2022) Currently not on any cholesterol medications  Intolerance to statins -pitavastatin 1 mg, simvastatin,atorvastatin, muscle aches weakness, joint pain Discussed next potential options (ezetimibe,PCSK-9 inhibitors, bempedoic acid and inclisiran); cost, dosing efficacy, side effects  Given the LDLc 128 ezetimibe would not be sufficient to lower the LDLc and does not have any risk reduction benefits so initiating PCSK9i would be reasonable and patient is in agreement   Plan: Will apply for PA for PCSK9i; will inform patient upon approval  Lipid lab due in 2-3 months after starting PCSK9i   Essential hypertension Assessment: BP is controlled (goal <130/80) in office BP 123/ 75 mmHg home BP ~ 120/91 on validated arm cuff Today would be last dose of clonidine  in cross taper schedule with carvedilol that Dr Ellyn Hack had prescribed Takes BP medications regularly and tolerates them well without any side effects Denies SOB, palpitation, chest pain, headaches,or swelling Reiterated the importance of medication adherence and low salt diet   Plan:  Continue taking carvedilol 6.25 mg twice daily olmesartan 20 mg daily Patient to keep record of BP readings with heart rate if its stay in above goal range reach out to [name]    Thank you,  Cammy Copa, Pharm.D [facility] [phone]  [address] [place], [place] [address]  Phone: [phone]; Fax: [phone]

## 2022-02-01 NOTE — Assessment & Plan Note (Addendum)
Assessment: BP is controlled (goal <130/80) in office BP 123/ 75 mmHg home BP ~ 120/91 on validated arm cuff Today would be last dose of clonidine in cross taper schedule with carvedilol that Dr Ellyn Hack had prescribed Takes BP medications regularly and tolerates them well without any side effects Denies SOB, palpitation, chest pain, headaches,or swelling Reiterated the importance of medication adherence and low salt diet   Plan:  Continue taking carvedilol 6.25 mg twice daily olmesartan 20 mg daily Patient to keep record of BP readings with heart rate if its stay in above goal range reach out to [name]

## 2022-02-01 NOTE — Assessment & Plan Note (Signed)
Assessment:  LDL goal: < 70 mg/dl  last LDLc 128 mg/dl (01/29/2022) Currently not on any cholesterol medications  Intolerance to statins -pitavastatin 1 mg, simvastatin,atorvastatin, muscle aches weakness, joint pain Discussed next potential options (ezetimibe,PCSK-9 inhibitors, bempedoic acid and inclisiran); cost, dosing efficacy, side effects  Given the LDLc 128 ezetimibe would not be sufficient to lower the LDLc and does not have any risk reduction benefits so initiating PCSK9i would be reasonable and patient is in agreement   Plan: Will apply for PA for PCSK9i; will inform patient upon approval  Lipid lab due in 2-3 months after starting [facility]

## 2022-02-05 ENCOUNTER — Other Ambulatory Visit (HOSPITAL_COMMUNITY): Payer: Self-pay

## 2022-02-05 ENCOUNTER — Telehealth: Payer: Self-pay

## 2022-02-05 DIAGNOSIS — E1169 Type 2 diabetes mellitus with other specified complication: Secondary | ICD-10-CM

## 2022-02-05 MED ORDER — REPATHA SURECLICK 140 MG/ML ~~LOC~~ SOAJ: 140.0000 mg | SUBCUTANEOUS | 3 refills | Status: DC

## 2022-02-05 NOTE — Telephone Encounter (Signed)
  Enrolled in [place] NO. [id number]   CARD STATUS Active   BIN [id number]   PCN [id number]   PC GROUP [id number] Prescription for 3 months with 3 refills sent to [facility] information communicated to patient via phone. Also inform patient to go for [place] lab mid April

## 2022-02-05 NOTE — Telephone Encounter (Signed)
Pharmacy Patient Advocate Encounter  Prior Authorization for Repatha '140mg'$ /ml has been approved.    key# [id number] Effective dates: 1.1.24 through 12.31.24

## 2022-02-06 ENCOUNTER — Telehealth (HOSPITAL_COMMUNITY): Payer: Self-pay | Admitting: *Deleted

## 2022-02-06 NOTE — Telephone Encounter (Signed)
Reaching out to patient to offer assistance regarding upcoming cardiac imaging study; pt's wife answered phone and verbalizes understanding of appt date/time, parking situation and where to check in, pre-test NPO status and medications ordered, and verified current allergies; name and call back number provided for further questions should they arise  Gordy Clement RN Navigator Cardiac Imaging [facility] [phone] office [phone] cell  Patient to take '50mg'$  metoprolol tartrate two hours prior to his cardiac CT scan. He will hold his morning BP meds and his wife is aware he is to arrive at 9:30am.

## 2022-02-07 ENCOUNTER — Ambulatory Visit (HOSPITAL_COMMUNITY)
Admission: RE | Admit: 2022-02-07 | Discharge: 2022-02-07 | Disposition: A | Discharge status: Home / Self Care | Payer: Medicare HMO | Source: Ambulatory Visit | Attending: Cardiology | Admitting: Cardiology

## 2022-02-07 ENCOUNTER — Other Ambulatory Visit: Payer: Self-pay | Admitting: Cardiovascular Disease

## 2022-02-07 DIAGNOSIS — I25118 Atherosclerotic heart disease of native coronary artery with other forms of angina pectoris: Secondary | ICD-10-CM | POA: Insufficient documentation

## 2022-02-07 DIAGNOSIS — R931 Abnormal findings on diagnostic imaging of heart and coronary circulation: Secondary | ICD-10-CM | POA: Diagnosis present

## 2022-02-07 DIAGNOSIS — R072 Precordial pain: Secondary | ICD-10-CM | POA: Insufficient documentation

## 2022-02-07 HISTORY — PX: CT CTA CORONARY W/CA SCORE W/CM &/OR WO/CM: HXRAD787

## 2022-02-07 MED ORDER — NITROGLYCERIN 0.4 MG SL SUBL
SUBLINGUAL_TABLET | SUBLINGUAL | Status: AC
  Filled 2022-02-07: qty 2

## 2022-02-07 MED ORDER — NITROGLYCERIN 0.4 MG SL SUBL
0.8000 mg | SUBLINGUAL_TABLET | Freq: Once | SUBLINGUAL | Status: AC
  Administered 2022-02-07: 0.8 mg via SUBLINGUAL

## 2022-02-07 MED ORDER — IOHEXOL 350 MG/ML SOLN
100.0000 mL | Freq: Once | INTRAVENOUS | Status: AC | PRN
  Administered 2022-02-07: 100 mL via INTRAVENOUS

## 2022-02-07 NOTE — Progress Notes (Signed)
CT FFR ordered.   Peter [name] T. Audie Box, MD, [place]  [address] [place], [place] [address] [phone]  2:26 PM

## 2022-02-08 ENCOUNTER — Ambulatory Visit (HOSPITAL_BASED_OUTPATIENT_CLINIC_OR_DEPARTMENT_OTHER)
Admission: RE | Admit: 2022-02-08 | Discharge: 2022-02-08 | Disposition: A | Discharge status: Home / Self Care | Payer: Medicare HMO | Source: Ambulatory Visit | Attending: Cardiovascular Disease | Admitting: Cardiovascular Disease

## 2022-02-08 DIAGNOSIS — I25118 Atherosclerotic heart disease of native coronary artery with other forms of angina pectoris: Secondary | ICD-10-CM | POA: Diagnosis not present

## 2022-02-08 DIAGNOSIS — R931 Abnormal findings on diagnostic imaging of heart and coronary circulation: Secondary | ICD-10-CM | POA: Diagnosis not present

## 2022-02-12 ENCOUNTER — Telehealth: Payer: Self-pay | Admitting: Cardiology

## 2022-02-12 DIAGNOSIS — J8410 Pulmonary fibrosis, unspecified: Secondary | ICD-10-CM

## 2022-02-12 NOTE — Telephone Encounter (Signed)
Gave spouse and pt results of CCTA per Dr. Ellyn Hack. Pulm referral made to [facility]. Pt to f/u with PCP.

## 2022-02-12 NOTE — Telephone Encounter (Signed)
Patient's wife is calling requesting patient's CT results. Please advise.

## 2022-02-13 ENCOUNTER — Telehealth: Payer: Self-pay | Admitting: Pulmonary Disease

## 2022-02-13 NOTE — Telephone Encounter (Signed)
Attempted to return call to [facility]).  Left detailed message advising patient to bring CD with images from [facility], because [facility] does not have access to images.  Call back number for any questions given.

## 2022-02-13 NOTE — Telephone Encounter (Signed)
PT has an referral appt appt w/Dr. Brion Aliment if they need to bring the CT results from [facility]) from Dec 23.Pls call to advise. Will we have access or should she bring?  PT wife @ [phone]

## 2022-02-14 NOTE — Telephone Encounter (Signed)
Opend in error

## 2022-03-01 ENCOUNTER — Ambulatory Visit: Payer: Medicare HMO

## 2022-03-05 ENCOUNTER — Encounter: Payer: Self-pay | Admitting: Pulmonary Disease

## 2022-03-05 ENCOUNTER — Ambulatory Visit: Payer: Medicare HMO | Admitting: Pulmonary Disease

## 2022-03-05 VITALS — BP 120/80 | HR 80 | Temp 97.9°F | Ht 73.0 in | Wt 207.4 lb

## 2022-03-05 DIAGNOSIS — J849 Interstitial pulmonary disease, unspecified: Secondary | ICD-10-CM | POA: Diagnosis not present
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
## 2022-03-05 NOTE — Patient Instructions (Signed)
Will get some additional tests for further evaluation of the lung disease Will schedule high-resolution and PFTs Return to clinic in 1 to 2 months.

## 2022-03-06 ENCOUNTER — Other Ambulatory Visit: Payer: Medicare HMO

## 2022-03-07 LAB — RHEUMATOID FACTOR: Rheumatoid fact SerPl-aCnc: <14 IU/mL (ref <14)

## 2022-03-07 LAB — ANTI-NUCLEAR AB-TITER (ANA TITER): ANA Titer 1: 1:40 {titer} — ABNORMAL HIGH

## 2022-03-07 LAB — CYCLIC CITRUL PEPTIDE ANTIBODY, IGG: Cyclic Citrullin Peptide Ab: <16 UNITS

## 2022-03-07 LAB — ANTI-DNA ANTIBODY, DOUBLE-STRANDED: ds DNA Ab: <1 IU/mL

## 2022-03-07 LAB — ANA: Anti Nuclear Antibody (ANA): POSITIVE — AB

## 2022-03-09 ENCOUNTER — Encounter: Payer: Self-pay | Admitting: Pulmonary Disease

## 2022-03-09 LAB — HYPERSENSITIVITY PNEUMONITIS
A. Pullulans Abs: NEGATIVE
A.Fumigatus #1 Abs: NEGATIVE
Micropolyspora faeni, IgG: NEGATIVE
Pigeon Serum Abs: NEGATIVE
Thermoact. Saccharii: NEGATIVE
Thermoactinomyces vulgaris, IgG: NEGATIVE

## 2022-03-13 ENCOUNTER — Ambulatory Visit: Payer: Medicare HMO | Admitting: Dermatology

## 2022-04-17 ENCOUNTER — Ambulatory Visit
Admission: RE | Admit: 2022-04-17 | Discharge: 2022-04-17 | Disposition: A | Discharge status: Home / Self Care | Payer: Medicare HMO | Source: Ambulatory Visit | Attending: Pulmonary Disease | Admitting: Pulmonary Disease

## 2022-04-17 DIAGNOSIS — J849 Interstitial pulmonary disease, unspecified: Secondary | ICD-10-CM

## 2022-04-24 ENCOUNTER — Ambulatory Visit (INDEPENDENT_AMBULATORY_CARE_PROVIDER_SITE_OTHER): Payer: Medicare HMO | Admitting: Pulmonary Disease

## 2022-04-24 ENCOUNTER — Encounter: Payer: Self-pay | Admitting: Pulmonary Disease

## 2022-04-24 ENCOUNTER — Ambulatory Visit: Payer: Medicare HMO | Admitting: Pulmonary Disease

## 2022-04-24 VITALS — BP 108/66 | HR 62 | Ht 73.0 in | Wt 195.6 lb

## 2022-04-24 DIAGNOSIS — J849 Interstitial pulmonary disease, unspecified: Secondary | ICD-10-CM

## 2022-04-24 LAB — PULMONARY FUNCTION TEST
DL/VA % pred: 84 %
DL/VA: 3.33 ml/min/mmHg/L
DLCO cor % pred: 78 %
DLCO cor: 22.09 ml/min/mmHg
DLCO unc % pred: 82 %
DLCO unc: 23.19 ml/min/mmHg
FEF 25-75 Post: 4.82 L/sec
FEF 25-75 Pre: 3.23 L/sec
FEF2575-%Change-Post: 49 %
FEF2575-%Pred-Post: 181 %
FEF2575-%Pred-Pre: 121 %
FEV1-%Change-Post: 14 %
FEV1-%Pred-Post: 117 %
FEV1-%Pred-Pre: 102 %
FEV1-Post: 4.18 L
FEV1-Pre: 3.67 L
FEV1FVC-%Change-Post: 12 %
FEV1FVC-%Pred-Pre: 101 %
FEV6-%Change-Post: 1 %
FEV6-%Pred-Post: 109 %
FEV6-%Pred-Pre: 107 %
FEV6-Post: 5.03 L
FEV6-Pre: 4.93 L
FEV6FVC-%Change-Post: 0 %
FEV6FVC-%Pred-Post: 105 %
FEV6FVC-%Pred-Pre: 105 %
FVC-%Change-Post: 1 %
FVC-%Pred-Post: 103 %
FVC-%Pred-Pre: 101 %
FVC-Post: 5.03 L
FVC-Pre: 4.94 L
Post FEV1/FVC ratio: 83 %
Post FEV6/FVC ratio: 100 %
Pre FEV1/FVC ratio: 74 %
Pre FEV6/FVC Ratio: 100 %
RV % pred: 77 %
RV: 2.07 L
TLC % pred: 92 %
TLC: 7.09 L

## 2022-04-24 NOTE — Progress Notes (Signed)
Full PFT performed today. °
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
## 2022-04-24 NOTE — Patient Instructions (Signed)
Full PFT performed today. °

## 2022-04-24 NOTE — Patient Instructions (Signed)
Your lung function test are stable.  CT scan shows mild changes of what we call interstitial lung disease.  Will continue to monitor this Order high-resolution CT in 1 year Follow-up in 6 months

## 2022-05-14 ENCOUNTER — Encounter: Payer: Self-pay | Admitting: Cardiology

## 2022-05-14 ENCOUNTER — Ambulatory Visit: Payer: Medicare HMO | Attending: Cardiology | Admitting: Cardiology

## 2022-05-14 VITALS — BP 105/71 | HR 69 | Ht 73.0 in | Wt 195.8 lb

## 2022-05-14 DIAGNOSIS — I872 Venous insufficiency (chronic) (peripheral): Secondary | ICD-10-CM

## 2022-05-14 DIAGNOSIS — E1169 Type 2 diabetes mellitus with other specified complication: Secondary | ICD-10-CM

## 2022-05-14 DIAGNOSIS — I10 Essential (primary) hypertension: Secondary | ICD-10-CM

## 2022-05-14 DIAGNOSIS — R072 Precordial pain: Secondary | ICD-10-CM

## 2022-05-14 DIAGNOSIS — I2510 Atherosclerotic heart disease of native coronary artery without angina pectoris: Secondary | ICD-10-CM

## 2022-05-14 DIAGNOSIS — G720 Drug-induced myopathy: Secondary | ICD-10-CM | POA: Diagnosis not present

## 2022-05-14 DIAGNOSIS — E785 Hyperlipidemia, unspecified: Secondary | ICD-10-CM

## 2022-05-14 DIAGNOSIS — T466X5A Adverse effect of antihyperlipidemic and antiarteriosclerotic drugs, initial encounter: Secondary | ICD-10-CM

## 2022-05-14 DIAGNOSIS — T466X5D Adverse effect of antihyperlipidemic and antiarteriosclerotic drugs, subsequent encounter: Secondary | ICD-10-CM

## 2022-05-14 DIAGNOSIS — Z7985 Long-term (current) use of injectable non-insulin antidiabetic drugs: Secondary | ICD-10-CM
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
## 2022-05-14 NOTE — Patient Instructions (Addendum)
Medication Instructions:    If dizziness occur    with orthostatic  issues - (low blood pressure )   may reduce Benicar( olmesartan) 20 mg to 1/2 tablet which is 10 mg   *If you need a refill on your cardiac medications before your next appointment, please call your pharmacy*   Lab Work: Not needed    Testing/Procedures: Not needed   Follow-Up: At [facility], you and your health needs are our priority.  As part of our continuing mission to provide you with exceptional heart care, we have created designated Provider Care Teams.  These Care Teams include your primary Cardiologist (physician) and Advanced Practice Providers (APPs -  Physician Assistants and Nurse Practitioners) who all work together to provide you with the care you need, when you need it.     Your next appointment:    6 month(s) can call in June to schedule a Oct 2024 appointment  The format for your next appointment:   In Person  Provider:   Bryan Lemma, MD

## 2022-05-25 LAB — LAB REPORT - SCANNED
A1c: 7.1
EGFR: 54

## 2022-05-26 ENCOUNTER — Encounter: Payer: Self-pay | Admitting: Cardiology

## 2022-05-26 NOTE — Assessment & Plan Note (Signed)
Has been intolerant of multiple different statins with myalgias and arthralgias.  Tolerating Repatha.  Still on co-Q10.

## 2022-05-26 NOTE — Assessment & Plan Note (Signed)
Not unexpectedly, no anginal pain.  He has mild mixed plaque in the RCA and otherwise mild calcified plaque in the LAD and LCx.  This would correlate to nonobstructive disease based on FFR ct.  Was started on Repatha with already excellent lipid management.  Plan: Continue carvedilol and Ozempic at current doses with well-controlled blood pressure.  (If he has dizziness and lightheadedness, he can back down to half dose of the Benicar) Continue Ozempic and Repatha. Continue co-Q10. With significantly elevated Coronary Calcium Score and mild plaque, reasonable to be on aspirin.  No bleeding issues.

## 2022-05-26 NOTE — Assessment & Plan Note (Signed)
Clearly noncardiac chest pain based on results of Coronary CTA.

## 2022-05-26 NOTE — Assessment & Plan Note (Signed)
Recommend foot elevation.  Support stockings if able to tolerate.  Foot exercises.

## 2022-05-26 NOTE — Assessment & Plan Note (Signed)
Notable improvement in LDL with just a couple doses of Repatha based on his labs from PCP.  He is due to get labs checked soon by PCP.  Anticipate that we will have pretty good control.  Still using co-Q10.

## 2022-05-26 NOTE — Assessment & Plan Note (Signed)
Blood pressure looks really good now.  Having come off clonidine made a big difference.  We may have been her back down on his Benicar if he has hypotension issues.  His blood pressure is pretty borderline low today.  He is taking once daily Benicar now senna twice daily and is on twice daily carvedilol.  If he is dizzy and lightheaded and has blood pressures less than 100, I told him to reduce his Benicar dose in half.

## 2022-07-31 ENCOUNTER — Encounter: Payer: Self-pay | Admitting: Student

## 2022-08-04 NOTE — Progress Notes (Signed)
Labs from 05/21/2022 Na+ 139, K+ 5.2, Cl- 99, HCO3-23, BUN he, Cr 1.38*, Glu 128, AST 27, ALT 24, AlkP 85 CBC: W 8.0, H/H 18/53.7, Plt 331 TC 120, TG 175, HDL 33, LDL 58; TSH 1.22; M8U 7.1.  Ferritin 42.  Testosterone 165, free T 10.2   Bryan Lemma, MD

## 2022-09-21 ENCOUNTER — Other Ambulatory Visit: Payer: Self-pay

## 2022-09-21 DIAGNOSIS — I7143 Infrarenal abdominal aortic aneurysm, without rupture: Secondary | ICD-10-CM

## 2022-10-29 ENCOUNTER — Other Ambulatory Visit: Payer: Self-pay

## 2022-10-29 ENCOUNTER — Encounter (HOSPITAL_COMMUNITY): Payer: Medicare HMO

## 2022-10-29 ENCOUNTER — Ambulatory Visit: Payer: Medicare HMO | Admitting: Surgery

## 2022-10-29 DIAGNOSIS — I6523 Occlusion and stenosis of bilateral carotid arteries: Secondary | ICD-10-CM

## 2022-11-06 ENCOUNTER — Encounter (HOSPITAL_COMMUNITY): Payer: Self-pay

## 2022-11-06 ENCOUNTER — Ambulatory Visit (HOSPITAL_COMMUNITY)
Admission: RE | Admit: 2022-11-06 | Discharge: 2022-11-06 | Disposition: A | Discharge status: Home / Self Care | Payer: Medicare HMO | Source: Ambulatory Visit | Attending: Surgery | Admitting: Surgery

## 2022-11-06 DIAGNOSIS — I7143 Infrarenal abdominal aortic aneurysm, without rupture: Secondary | ICD-10-CM | POA: Insufficient documentation

## 2022-11-06 LAB — POCT I-STAT CREATININE: Creatinine, Ser: 1.3 mg/dL — ABNORMAL HIGH (ref 0.61–1.24)

## 2022-11-06 MED ORDER — IOHEXOL 350 MG/ML SOLN
100.0000 mL | Freq: Once | INTRAVENOUS | Status: AC | PRN
  Administered 2022-11-06: 100 mL via INTRAVENOUS
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
## 2022-11-12 ENCOUNTER — Ambulatory Visit
Admission: RE | Admit: 2022-11-12 | Discharge: 2022-11-12 | Disposition: A | Discharge status: Home / Self Care | Payer: Medicare HMO | Source: Ambulatory Visit | Attending: Surgery | Admitting: Surgery

## 2022-11-12 ENCOUNTER — Encounter: Payer: Self-pay | Admitting: General Practice

## 2022-11-12 ENCOUNTER — Ambulatory Visit: Payer: Medicare HMO | Admitting: General Practice

## 2022-11-12 ENCOUNTER — Encounter: Payer: Self-pay | Admitting: Surgery

## 2022-11-12 ENCOUNTER — Ambulatory Visit: Payer: Medicare HMO | Admitting: Surgery

## 2022-11-12 VITALS — BP 106/66 | HR 62 | Ht 73.0 in | Wt 186.6 lb

## 2022-11-12 VITALS — BP 122/81 | HR 61 | Temp 98.0°F | Resp 20 | Ht 73.0 in | Wt 185.0 lb

## 2022-11-12 DIAGNOSIS — I2510 Atherosclerotic heart disease of native coronary artery without angina pectoris: Secondary | ICD-10-CM | POA: Insufficient documentation

## 2022-11-12 DIAGNOSIS — I872 Venous insufficiency (chronic) (peripheral): Secondary | ICD-10-CM | POA: Insufficient documentation

## 2022-11-12 DIAGNOSIS — I6523 Occlusion and stenosis of bilateral carotid arteries: Secondary | ICD-10-CM | POA: Diagnosis present

## 2022-11-12 DIAGNOSIS — I10 Essential (primary) hypertension: Secondary | ICD-10-CM

## 2022-11-12 DIAGNOSIS — E1169 Type 2 diabetes mellitus with other specified complication: Secondary | ICD-10-CM | POA: Diagnosis not present

## 2022-11-12 DIAGNOSIS — E785 Hyperlipidemia, unspecified: Secondary | ICD-10-CM | POA: Insufficient documentation

## 2022-11-12 DIAGNOSIS — I7143 Infrarenal abdominal aortic aneurysm, without rupture: Secondary | ICD-10-CM | POA: Diagnosis not present

## 2022-11-12 DIAGNOSIS — R5383 Other fatigue: Secondary | ICD-10-CM | POA: Insufficient documentation
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
## 2022-11-12 NOTE — Patient Instructions (Signed)
Medication Instructions:  The current medical regimen is effective;  continue present plan and medications as directed. Please refer to the Current Medication list given to you today.  *If you need a refill on your cardiac medications before your next appointment, please call your pharmacy*  Lab Work: NONE  Testing/Procedures: Your physician has requested that you have an echocardiogram. Echocardiography is a painless test that uses sound waves to create images of your heart. It provides your doctor with information about the size and shape of your heart and how well your heart's chambers and valves are working. This procedure takes approximately one hour. There are no restrictions for this procedure. Please do NOT wear cologne, perfume, aftershave, or lotions (deodorant is allowed). Please arrive 15 minutes prior to your appointment time.   Other Instructions INCREASE PHYSICAL ACTIVITY-AS TOLERATED  Follow-Up: At [facility], you and your health needs are our priority.  As part of our continuing mission to provide you with exceptional heart care, we have created designated Provider Care Teams.  These Care Teams include your primary Cardiologist (physician) and Advanced Practice Providers (APPs -  Physician Assistants and Nurse Practitioners) who all work together to provide you with the care you need, when you need it.  Your next appointment:   2-3 month(s)  Provider:   Bryan Lemma, MD  or Edd Fabian, FNP

## 2022-11-26 ENCOUNTER — Telehealth: Payer: Self-pay | Admitting: Pulmonary Disease

## 2022-11-26 NOTE — Telephone Encounter (Signed)
Pt wife returning the call

## 2022-11-26 NOTE — Telephone Encounter (Signed)
ATC patient on home and cell #.  No answer.  LVM on cell # to return call to get more details on symptoms patient is having.

## 2022-11-26 NOTE — Telephone Encounter (Signed)
This has been going on for 2 months, it go more painful and then less painful.  Called and spoke with patient, he states has a pain when he tries to get a deep breath and lays down at night.  Dull ache.  It is occasionally.  A little sob and clear mucous, this happens more than one time per day.  He is not on any oxygen and is on no inhalers.  He said it has him worried because he has never had anything like this before.  Advised him that Dr. Maple Hudson could see him tomorrow at 11:30 am, he would need to arrive by 11:15 am for check in.  He had a CT scan of his check in Oct. 2024 and has not gotten the results.  He verbalized understanding.  Nothing further needed.

## 2022-11-26 NOTE — Telephone Encounter (Signed)
Pt having pains in his lungs when breathing, been going on for a couple of months. Pt is sch for Dr. Isaiah Serge on 01/22, got pt a earlier appt w Dr. Marchelle Gearing for a acute visit due to that being the first avail

## 2022-11-27 ENCOUNTER — Ambulatory Visit: Payer: Medicare HMO

## 2022-11-27 ENCOUNTER — Telehealth: Payer: Self-pay | Admitting: *Deleted

## 2022-11-27 ENCOUNTER — Encounter: Payer: Self-pay | Admitting: Adult Health

## 2022-11-27 ENCOUNTER — Ambulatory Visit: Payer: Medicare HMO | Admitting: Adult Health

## 2022-11-27 VITALS — BP 126/64 | HR 70 | Temp 98.2°F | Ht 73.0 in | Wt 185.6 lb

## 2022-11-27 DIAGNOSIS — I2510 Atherosclerotic heart disease of native coronary artery without angina pectoris: Secondary | ICD-10-CM | POA: Diagnosis not present

## 2022-11-27 DIAGNOSIS — R091 Pleurisy: Secondary | ICD-10-CM | POA: Diagnosis not present

## 2022-11-27 DIAGNOSIS — R5383 Other fatigue: Secondary | ICD-10-CM | POA: Diagnosis not present

## 2022-11-27 NOTE — Patient Instructions (Addendum)
Begin Zyrtec 10mg   At bedtime  1 week and then As needed   Saline nasal gel At bedtime   Check overnight oximetry test.  Chest xray today  Echo as planned per cardiology  Follow up with Dr. Isaiah Serge in January as planned and As needed   Please contact office for sooner follow up if symptoms do not improve or worsen or seek emergency care

## 2022-11-27 NOTE — Telephone Encounter (Signed)
Called AP and spoke with someone in Cardiac Rehab as Edwyna Shell was busy.  I advised that somehow his Echo scheduled for 12/4 at 10:30 am got cancelled and requested that it be rescheduled for the same day and time.  She verbalized understanding and stated she would give the message to [facility].  Nothing further needed.

## 2022-11-27 NOTE — Progress Notes (Signed)
@Patient  ID: Peter [name], male    DOB: 10/11/1950, 72 y.o.   MRN: [id number]  Chief Complaint  Patient presents with   Follow-up   Discussed the use of AI scribe software for clinical note transcription with the patient, who gave verbal consent to proceed.  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  He has occasional exposure to manual and moldy hay.  He brought down pillows in summer 2024. Smoking history: 20-pack-year smoker.  Quit in 1990 Travel history: No significant travel history Relevant family history: Father and brothers have COPD.  There were smokers.  CTA 09/08/2020 [facility]-bilateral peripheral reticulation. CT chest 06/24/2021-mild peripheral reticulation noted bilaterally CT coronary 02/07/2022-visualized lung images show coarsened interstitial markings, mild groundglass with traction bronchiectasis at the lung base. High resolution CT 04/17/2022-mild interstitial lung disease with no gradient, air trapping.  Alternate diagnosis.  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  No obstruction, restriction or diffusion impairment    Hypersensitivity pneumonitis panel February 2024 negative ANA mildly positive, CCP negative, rheumatoid factor negative.  11/27/2022 Acute OV  Patient presents for an acute office visit.  He is followed for mild interstitial lung disease.  He is a former smoker with some mild emphysema noted on CT imaging.  He complains of bilateral chest discomfort over the last 1 to 2 months.  The pain is described as occurring during deep inhalation or forceful  exhalation, and is localized to the lower chest area bilaterally. the patient also reports experiencing this pain at night when turning over in bed.  In addition to the chest pain, the patient has been experiencing a cough with production of cloudy mucus for the past month.  He denies any fever.  No discolored mucus.  Does have some postnasal drainage.  The patient denies any history of asthma or COPD, despite a past history of smoking.  The patient also reports a lack of energy, which has been ongoing for the past two years. Despite this, the patient tries to maintain an active lifestyle by walking daily and does not report any associated shortness of breath.  He was seen by cardiology 2 weeks ago.  Has been set up for an echo that is pending, scheduled for the first week of December.  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 The patient also reports occasional bouts of hiccups, which are managed with metoclopramide.  He also has neuropathy and is on gabapentin.  Does complain that he has very low energy.  He is followed by hematology for polycythemia.  No history of blood clots.  He denies any hemoptysis, chest pain, calf pain.  No edema.  The patient's medications also include Ozempic for diabetes management and Zyrtec for allergies. The patient has been experiencing postnasal drainage, which is suspected to be due to allergies.     Patient is followed for mild interstitial lung disease changes.  Autoimmune workup was unrevealing.  Patient declined further aggressive workup.  Was recommended for follow-up CT in April 2025. CT angio chest/abdomen/pelvis  November 06, 2022 shows mild emphysema otherwise clear.   Allergies  Allergen Reactions   Penicillins Other (See Comments)    Has patient had a PCN reaction causing immediate rash, facial/tongue/throat swelling, SOB  or lightheadedness with hypotension: No Has patient had a PCN reaction causing severe rash involving mucus membranes or skin necrosis: No Has patient had a PCN reaction that required hospitalization: No Has patient had a PCN reaction occurring within the last 10 years: No If all of the above answers are "NO", then may proceed with Cephalosporin use.    Statins Other (See Comments)    Significant limiting myalgias and arthralgias. ->  Has tried atorvastatin, simvastatin, rosuvastatin and he is not sure about pravastatin versus lovastatin.     There is no immunization history on file for this patient.  Past Medical History:  Diagnosis Date   AAA (abdominal aortic aneurysm) (HCC)    has graft (Dr. 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   Right Chest (curet and 5FU)   Best vitelliform macular dystrophy    folowed at [facility]   Chronic low back pain    Diabetes mellitus without complication (HCC)    type 2   Essential hypertension    Foot pain    GERD (gastroesophageal reflux disease)    History of kidney stones    Low testosterone in male    Macular degeneration    Palpitations    Polycythemia    Varicose veins     Tobacco History: Social History   Tobacco Use  Smoking Status Former   Current packs/day: 1.00   Average packs/day: 1 pack/day for 20.0 years (20.0 ttl pk-yrs)   Types: Cigarettes   Passive exposure: Past  Smokeless Tobacco Former   Types: Chew  Tobacco Comments   Quit chewing 01/2022   Counseling given: Not Answered Tobacco comments: Quit chewing 01/2022   Outpatient Medications Prior to Visit  Medication Sig Dispense Refill   aspirin EC 81 MG tablet Take 81 mg by mouth once.     carvedilol (COREG) 6.25 MG tablet Take 1 tablet (6.25 mg total) by mouth 2 (two) times daily. 180 tablet 3   cholecalciferol (VITAMIN D3) 25 MCG (1000 UNIT) tablet Take 1,000 Units by mouth daily.     Coenzyme Q10 200 MG capsule  Co Q10     Continuous Glucose Receiver (DEXCOM G6 RECEIVER) DEVI 1 sq check as needed for 90 days     Continuous Glucose Sensor (DEXCOM G6 SENSOR) MISC 1 sensor sq every 10 days for 90 days     Evolocumab (REPATHA SURECLICK) 140 MG/ML SOAJ Inject 140 mg into the skin every 14 (fourteen) days. 6 mL 3   gabapentin (NEURONTIN) 300 MG capsule Take 300 mg by mouth daily.     hydrocortisone 2.5 % cream Apply 1 Application topically as needed.     ketoconazole (NIZORAL) 2 % shampoo Apply 1 Application topically 2 (two) times a week.     LORazepam (ATIVAN) 1 MG tablet Take 1 mg by mouth at bedtime.     meclizine (ANTIVERT) 25 MG tablet Take 25 mg by mouth as needed.     metoCLOPramide (REGLAN) 10 MG tablet Take 10 mg by mouth daily.     olmesartan (BENICAR) 20 MG tablet Take 20 mg by mouth as needed.     pantoprazole (PROTONIX) 40 MG tablet Take 40 mg by mouth daily.  polyethylene glycol powder (MIRALAX) 17 GM/SCOOP powder Take 1 Container by mouth daily.     pyridoxine (B-6) 100 MG tablet Take 100 mg by mouth daily.     Semaglutide, 1 MG/DOSE, (OZEMPIC, 1 MG/DOSE,) 2 MG/1.5ML SOPN Inject 1 mg into the skin once a week.     sertraline (ZOLOFT) 100 MG tablet Take 100 mg by mouth daily.     Testosterone 10 MG/ACT (2%) GEL Taking 60mg  daily     No facility-administered medications prior to visit.     Review of Systems:   Constitutional:   No  weight loss, night sweats,  Fevers, chills, +fatigue, or  lassitude.  HEENT:   No headaches,  Difficulty swallowing,  Tooth/dental problems, or  Sore throat,                No sneezing, itching, ear ache, nasal congestion, post nasal drip,   CV:  No chest pain,  Orthopnea, PND, swelling in lower extremities, anasarca, dizziness, palpitations, syncope.   GI  No heartburn, indigestion, abdominal pain, nausea, vomiting, diarrhea, change in bowel habits, loss of appetite, bloody stools.   Resp: No chest wall deformity  Skin: no rash or lesions.  GU: no  dysuria, change in color of urine, no urgency or frequency.  No flank pain, no hematuria   MS:  No joint pain or swelling.  No decreased range of motion.  No back pain.    Physical Exam  BP 126/64 (BP Location: Left Arm, Patient Position: Sitting, Cuff Size: Normal)   Pulse 70   Temp 98.2 F (36.8 C) (Oral)   Ht 6\' 1"  (1.854 m)   Wt 185 lb 9.6 oz (84.2 kg)   SpO2 94%   BMI 24.49 kg/m   GEN: A/Ox3; pleasant , NAD, well nourished    HEENT:  [place]/AT,  NOSE-clear, THROAT-clear, no lesions, no postnasal drip or exudate noted.   NECK:  Supple w/ fair ROM; no JVD; normal carotid impulses w/o bruits; no thyromegaly or nodules palpated; no lymphadenopathy.    RESP  Clear  P & A; w/o, wheezes/ rales/ or rhonchi. no accessory muscle use, no dullness to percussion, no tenderness in chest wall.   CARD:  RRR, no m/r/g, no peripheral edema, pulses intact, no cyanosis or clubbing.  GI:   Soft & nt; nml bowel sounds; no organomegaly or masses detected.   Musco: Warm bil, no deformities or joint swelling noted.   Neuro: alert, no focal deficits noted.    Skin: Warm, no lesions or rashes    Lab Results:    BNP No results found for: "BNP"  ProBNP No results found for: "PROBNP"  Imaging:   Administration History     None          Latest Ref Rng & Units 04/24/2022    2:30 PM  PFT Results  FVC-Pre L 4.94   FVC-Predicted Pre % 101   FVC-Post L 5.03   FVC-Predicted Post % 103   Pre FEV1/FVC % % 74   Post FEV1/FCV % % 83   FEV1-Pre L 3.67   FEV1-Predicted Pre % 102   FEV1-Post L 4.18   DLCO uncorrected ml/min/mmHg 23.19   DLCO UNC% % 82   DLCO corrected ml/min/mmHg 22.09   DLCO COR %Predicted % 78   DLVA Predicted % 84   TLC L 7.09   TLC % Predicted % 92   RV % Predicted % 77     No results found for: "NITRICOXIDE"  Assessment & Plan:   Assessment and Plan    Pleuritic Chest Pain   He has experienced intermittent bilateral chest pain for 1-2 months,  worsening with deep breaths and exhalation, without wheezing, significant cough, or discolored sputum. Considering pleurisy, bronchitis, or fluid accumulation as differential diagnoses, a recent CT scan revealed mild emphysema but no acute findings. We will order a chest x-ray, check oxygen levels during sleep, and treat with Zyrtec for allergies, alongside using saline nasal gel at bedtime. He is advised to follow up with cardiology for an echocardiogram on December 4th.    Mild Emphysema   Mild emphysema was noted on a recent CT scan, with no significant shortness of breath or activity limitation reported. His PFT  in April was normal.   Polycythemia   His elevated hemoglobin is likely secondary to testosterone therapy, managed by an oncologist with periodic phlebotomy.  Check Overnight oximetry .     Peripheral Neuropathy   His peripheral neuropathy, secondary to diabetes, is managed with gabapentin, with symptoms including fatigue and low energy. We discussed that neuropathy medications could contribute to fatigue   General Health Maintenance   He is under the care of multiple specialists for various conditions. We emphasized the importance of regular follow-ups with all specialists to manage complex health needs effectively, encouraging regular follow-ups with all specialists and monitoring for any new symptoms or changes in health status.  Follow-up   He is scheduled to follow up with cardiology for an echocardiogram on December 4th and with pulmonology in January. He is advised to call if symptoms worsen or new symptoms develop.            Rubye Oaks, NP 11/27/2022

## 2022-11-27 NOTE — Addendum Note (Signed)
Addended by: Gay Filler T on: 11/27/2022 04:14 PM   Modules accepted: Orders

## 2022-11-28 ENCOUNTER — Other Ambulatory Visit: Payer: Self-pay | Admitting: General Practice

## 2022-11-28 ENCOUNTER — Other Ambulatory Visit: Payer: Self-pay

## 2022-11-28 DIAGNOSIS — I2510 Atherosclerotic heart disease of native coronary artery without angina pectoris: Secondary | ICD-10-CM

## 2022-11-28 DIAGNOSIS — R5383 Other fatigue: Secondary | ICD-10-CM

## 2022-11-28 DIAGNOSIS — I7143 Infrarenal abdominal aortic aneurysm, without rupture: Secondary | ICD-10-CM

## 2022-11-29 ENCOUNTER — Telehealth: Payer: Self-pay | Admitting: Adult Health

## 2022-11-29 NOTE — Telephone Encounter (Signed)
Maureen Ralphs wife would like results of patient's xray. Maureen Ralphs phone number is [phone].

## 2022-12-03 NOTE — Telephone Encounter (Signed)
Called and spoke with pt about cxr results. Both verbalized understanding, nfn

## 2022-12-17 ENCOUNTER — Ambulatory Visit: Payer: Medicare HMO | Admitting: Internal Medicine

## 2022-12-18 ENCOUNTER — Other Ambulatory Visit (HOSPITAL_COMMUNITY): Payer: Medicare HMO

## 2022-12-18 ENCOUNTER — Other Ambulatory Visit: Payer: Self-pay | Admitting: Cardiology

## 2022-12-18 DIAGNOSIS — I2510 Atherosclerotic heart disease of native coronary artery without angina pectoris: Secondary | ICD-10-CM

## 2022-12-18 DIAGNOSIS — E1169 Type 2 diabetes mellitus with other specified complication: Secondary | ICD-10-CM

## 2022-12-18 DIAGNOSIS — I6523 Occlusion and stenosis of bilateral carotid arteries: Secondary | ICD-10-CM

## 2022-12-18 LAB — LAB REPORT - SCANNED
A1c: 6.4
EGFR: 72

## 2022-12-19 ENCOUNTER — Ambulatory Visit (HOSPITAL_COMMUNITY)
Admission: RE | Admit: 2022-12-19 | Discharge: 2022-12-19 | Disposition: A | Discharge status: Home / Self Care | Payer: Medicare HMO | Source: Ambulatory Visit | Attending: General Practice | Admitting: General Practice

## 2022-12-19 ENCOUNTER — Other Ambulatory Visit (HOSPITAL_COMMUNITY): Payer: Medicare HMO

## 2022-12-19 DIAGNOSIS — I2510 Atherosclerotic heart disease of native coronary artery without angina pectoris: Secondary | ICD-10-CM | POA: Insufficient documentation

## 2022-12-19 DIAGNOSIS — R5383 Other fatigue: Secondary | ICD-10-CM | POA: Diagnosis present

## 2022-12-19 LAB — ECHOCARDIOGRAM COMPLETE
AR max vel: 2.89 cm2
AV Area VTI: 2.91 cm2
AV Area mean vel: 2.91 cm2
AV Mean grad: 3 mm[Hg]
AV Peak grad: 6.2 mm[Hg]
Ao pk vel: 1.24 m/s
Area-P 1/2: 2.56 cm2
P 1/2 time: 920 ms
S' Lateral: 2.1 cm

## 2022-12-26 ENCOUNTER — Ambulatory Visit: Payer: Medicare HMO | Admitting: Internal Medicine

## 2023-01-10 ENCOUNTER — Other Ambulatory Visit: Payer: Self-pay | Admitting: Cardiology

## 2023-01-25 ENCOUNTER — Telehealth: Payer: Self-pay | Admitting: Cardiology

## 2023-01-25 NOTE — Telephone Encounter (Signed)
 Pt called in stating he needs PA for his Repatha since he has new insurance now under [employer]

## 2023-01-28 ENCOUNTER — Other Ambulatory Visit (HOSPITAL_COMMUNITY): Payer: Self-pay

## 2023-01-28 ENCOUNTER — Telehealth: Payer: Self-pay | Admitting: Pharmacy Technician

## 2023-01-28 NOTE — Telephone Encounter (Signed)
 Pharmacy Patient Advocate Encounter   Received notification from Pt Calls Messages that prior authorization for repatha  is required/requested.   Insurance verification completed.   The patient is insured through [facility] [employer] .   Per test claim: PA required; PA submitted to above mentioned insurance via CoverMyMeds Key/confirmation #/EOC [id number] Status is pending

## 2023-01-28 NOTE — Telephone Encounter (Signed)
 Pharmacy Patient Advocate Encounter  Received notification from [employer] that Prior Authorization for repatha  has been APPROVED from 01/28/23 to 07/28/23. Ran test claim, Copay is $117.50 3 months. This test claim was processed through [facility]- copay amounts may vary at other pharmacies due to pharmacy/plan contracts, or as the patient moves through the different stages of their insurance plan.   PA #/Case ID/Reference #: [id number]

## 2023-01-31 ENCOUNTER — Other Ambulatory Visit (HOSPITAL_COMMUNITY): Payer: Self-pay

## 2023-02-06 ENCOUNTER — Encounter: Payer: Self-pay | Admitting: Pulmonary Disease

## 2023-02-06 ENCOUNTER — Ambulatory Visit: Payer: PPO | Admitting: Pulmonary Disease

## 2023-02-06 VITALS — BP 116/77 | HR 65 | Ht 74.0 in | Wt 190.0 lb

## 2023-02-06 DIAGNOSIS — J849 Interstitial pulmonary disease, unspecified: Secondary | ICD-10-CM
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
## 2023-02-06 NOTE — Patient Instructions (Signed)
VISIT SUMMARY:  During today's visit, we reviewed your history of interstitial lung disease and discussed your recent chest pain, which has since resolved. We also went over your recent imaging results and planned future monitoring.  YOUR PLAN:  -INTERSTITIAL LUNG DISEASE: Interstitial lung disease involves mild scarring and inflammation in the lungs. Your condition is currently stable with no new symptoms. A recent CT scan showed no progression. We will repeat the CT scan in November 2025 to monitor for any changes. Please return for a follow-up appointment in 10 months, prior to the next CT scan.  -CHEST PAIN: Your chest pain has resolved, and a recent chest x-ray was normal. No further action is required at this time.  INSTRUCTIONS:  Please return for a follow-up appointment in 10 months, prior to your next CT scan in November 2025.
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
## 2023-02-21 ENCOUNTER — Encounter: Payer: Self-pay | Admitting: General Practice

## 2023-02-21 ENCOUNTER — Ambulatory Visit: Payer: PPO | Attending: General Practice | Admitting: General Practice

## 2023-02-21 VITALS — BP 118/78 | HR 70 | Ht 73.0 in | Wt 195.6 lb

## 2023-02-21 DIAGNOSIS — E1169 Type 2 diabetes mellitus with other specified complication: Secondary | ICD-10-CM | POA: Diagnosis not present

## 2023-02-21 DIAGNOSIS — R002 Palpitations: Secondary | ICD-10-CM

## 2023-02-21 DIAGNOSIS — I2510 Atherosclerotic heart disease of native coronary artery without angina pectoris: Secondary | ICD-10-CM

## 2023-02-21 DIAGNOSIS — R5383 Other fatigue: Secondary | ICD-10-CM

## 2023-02-21 DIAGNOSIS — E785 Hyperlipidemia, unspecified: Secondary | ICD-10-CM

## 2023-02-21 DIAGNOSIS — I10 Essential (primary) hypertension: Secondary | ICD-10-CM | POA: Diagnosis not present

## 2023-02-21 NOTE — Patient Instructions (Signed)
 Medication Instructions:  The current medical regimen is effective;  continue present plan and medications as directed. Please refer to the Current Medication list given to you today.  *If you need a refill on your cardiac medications before your next appointment, please call your pharmacy*   Lab Work: NONE If you have labs (blood work) drawn today and your tests are completely normal, you will receive your results only by:  MyChart Message (if you have MyChart) OR  A paper copy in the mail. If you have any lab test that is abnormal or we need to change your treatment, we will call you to review the results.  Other Instructions CONTINUE BLOOD PRESSURE LOG  Follow-Up: At [facility], you and your health needs are our priority.  As part of our continuing mission to provide you with exceptional heart care, we have created designated Provider Care Teams.  These Care Teams include your primary Cardiologist (physician) and Advanced Practice Providers (APPs -  Physician Assistants and Nurse Practitioners) who all work together to provide you with the care you need, when you need it.  Your next appointment:   9-12 month(s)  Provider:   Alm Clay, MD

## 2023-03-12 ENCOUNTER — Other Ambulatory Visit: Payer: Self-pay | Admitting: Cardiology

## 2023-03-12 DIAGNOSIS — I2510 Atherosclerotic heart disease of native coronary artery without angina pectoris: Secondary | ICD-10-CM

## 2023-03-12 DIAGNOSIS — I6523 Occlusion and stenosis of bilateral carotid arteries: Secondary | ICD-10-CM

## 2023-03-12 DIAGNOSIS — E1169 Type 2 diabetes mellitus with other specified complication: Secondary | ICD-10-CM

## 2023-03-21 ENCOUNTER — Other Ambulatory Visit (HOSPITAL_COMMUNITY): Payer: Self-pay

## 2023-03-21 MED ORDER — FREESTYLE LIBRE 2 PLUS SENSOR MISC
3 refills | Status: AC
  Filled 2023-03-21: qty 6, 84d supply, fill #0
  Filled 2023-06-14: qty 6, 84d supply, fill #1
  Filled 2023-09-02: qty 6, 84d supply, fill #2
  Filled 2023-11-29: qty 6, 84d supply, fill #3

## 2023-03-22 ENCOUNTER — Other Ambulatory Visit: Payer: Self-pay

## 2023-06-14 ENCOUNTER — Other Ambulatory Visit (HOSPITAL_COMMUNITY): Payer: Self-pay

## 2023-08-23 ENCOUNTER — Telehealth: Payer: Self-pay | Admitting: Pharmacy Technician

## 2023-08-23 DIAGNOSIS — I2510 Atherosclerotic heart disease of native coronary artery without angina pectoris: Secondary | ICD-10-CM

## 2023-08-23 DIAGNOSIS — I6523 Occlusion and stenosis of bilateral carotid arteries: Secondary | ICD-10-CM

## 2023-08-23 DIAGNOSIS — E1169 Type 2 diabetes mellitus with other specified complication: Secondary | ICD-10-CM

## 2023-08-23 NOTE — Telephone Encounter (Signed)
 Insurance is asking for labs done within the last 120 days. The last labs I see are older than that. Can he have updated labs so we can continue with this prior authorization? Thank you!   Pharmacy Patient Advocate Encounter   Received notification from CoverMyMeds that prior authorization for REPATHA  is required/requested.   Insurance verification completed.   The patient is insured through [facility] ADVANTAGE/RX ADVANCE .   Per test claim: PA required; PA started via CoverMyMeds. KEY [id number] . Please see clinical question(s) below that I am not finding the answer to in their chart and advise.

## 2023-08-23 NOTE — Telephone Encounter (Signed)
 Called and spoke to patient. Advised patient that he needed to have updated labs to continue prior authorization for Repatha . Patient states he has had lab work recently and gave phone to wife to further clarify. Spoke with patient's wife Peter [name] (per [facility]). Patients wife states that PCP Dr. Omar Slocumb ordered labs 06/13/23. Lipid panel from 06/13/23 can be found in [facility].

## 2023-08-26 ENCOUNTER — Other Ambulatory Visit (HOSPITAL_COMMUNITY): Payer: Self-pay

## 2023-08-26 NOTE — Telephone Encounter (Signed)
 Pharmacy Patient Advocate Encounter  Received notification from [employer] that Prior Authorization for repatha  has been APPROVED from 08/26/23 to 08/25/24   PA #/Case ID/Reference #: [id number]

## 2023-08-26 NOTE — Telephone Encounter (Signed)
 Hi, I am sorry; when I searched for labs before for some reason I did not see that. Thank you!   Pharmacy Patient Advocate Encounter   Received notification from Pt Calls Messages that prior authorization for repatha  is required/requested.   Insurance verification completed.   The patient is insured through [facility] [employer]/RX [employer] .   Per test claim: PA required; PA submitted to above mentioned insurance via latent Key/confirmation #/EOC [id number] Status is pending

## 2023-08-27 MED ORDER — REPATHA SURECLICK 140 MG/ML ~~LOC~~ SOAJ: 140.0000 mg | SUBCUTANEOUS | 3 refills | Status: AC

## 2023-08-27 NOTE — Addendum Note (Signed)
 Addended by: GLADIS REENA GAILS on: 08/27/2023 05:53 PM   Modules accepted: Orders

## 2023-08-27 NOTE — Telephone Encounter (Signed)
 LEFT MESSAGE TO PATIENT .repatha   has been approved and refilled.

## 2023-09-02 ENCOUNTER — Other Ambulatory Visit (HOSPITAL_COMMUNITY): Payer: Self-pay

## 2023-10-16 ENCOUNTER — Ambulatory Visit: Admitting: Pulmonary Disease

## 2023-11-05 ENCOUNTER — Ambulatory Visit: Admitting: Pulmonary Disease

## 2023-11-06 ENCOUNTER — Ambulatory Visit
Admission: RE | Admit: 2023-11-06 | Discharge: 2023-11-06 | Disposition: A | Source: Ambulatory Visit | Attending: Pulmonary Disease

## 2023-11-06 DIAGNOSIS — J849 Interstitial pulmonary disease, unspecified: Secondary | ICD-10-CM

## 2023-11-07 ENCOUNTER — Ambulatory Visit: Admitting: Pulmonary Disease

## 2023-11-21 ENCOUNTER — Encounter: Payer: Self-pay | Admitting: Pulmonary Disease

## 2023-11-21 ENCOUNTER — Ambulatory Visit: Admitting: Pulmonary Disease

## 2023-11-21 VITALS — BP 104/70 | HR 69 | Temp 97.6°F | Ht 73.0 in | Wt 190.0 lb

## 2023-11-21 DIAGNOSIS — Z87891 Personal history of nicotine dependence: Secondary | ICD-10-CM

## 2023-11-21 DIAGNOSIS — J849 Interstitial pulmonary disease, unspecified: Secondary | ICD-10-CM

## 2023-11-21 NOTE — Patient Instructions (Signed)
  VISIT SUMMARY: You had a follow-up visit for your interstitial lung disease. Your condition remains stable with no new symptoms or changes in your CT scan results.  YOUR PLAN: INTERSTITIAL LUNG DISEASE: You have mild scarring and inflammation in your lungs, which has remained stable since 2022. -Continue to monitor your condition with annual CT scans. -Your next follow-up appointment is scheduled for one year from now. -Call us  if you notice any changes in your symptoms before your next appointment.
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
## 2023-11-22 ENCOUNTER — Ambulatory Visit: Attending: Cardiology | Admitting: Cardiology

## 2023-11-22 VITALS — BP 104/60 | HR 58 | Ht 74.0 in | Wt 191.8 lb

## 2023-11-22 DIAGNOSIS — E1169 Type 2 diabetes mellitus with other specified complication: Secondary | ICD-10-CM | POA: Diagnosis not present

## 2023-11-22 DIAGNOSIS — I83893 Varicose veins of bilateral lower extremities with other complications: Secondary | ICD-10-CM

## 2023-11-22 DIAGNOSIS — I10 Essential (primary) hypertension: Secondary | ICD-10-CM

## 2023-11-22 DIAGNOSIS — I2510 Atherosclerotic heart disease of native coronary artery without angina pectoris: Secondary | ICD-10-CM | POA: Diagnosis not present

## 2023-11-22 DIAGNOSIS — I7143 Infrarenal abdominal aortic aneurysm, without rupture: Secondary | ICD-10-CM

## 2023-11-22 DIAGNOSIS — T466X5D Adverse effect of antihyperlipidemic and antiarteriosclerotic drugs, subsequent encounter: Secondary | ICD-10-CM

## 2023-11-22 DIAGNOSIS — M48061 Spinal stenosis, lumbar region without neurogenic claudication: Secondary | ICD-10-CM

## 2023-11-22 DIAGNOSIS — R002 Palpitations: Secondary | ICD-10-CM

## 2023-11-22 DIAGNOSIS — E785 Hyperlipidemia, unspecified: Secondary | ICD-10-CM

## 2023-11-22 DIAGNOSIS — E1142 Type 2 diabetes mellitus with diabetic polyneuropathy: Secondary | ICD-10-CM

## 2023-11-22 DIAGNOSIS — G720 Drug-induced myopathy: Secondary | ICD-10-CM

## 2023-11-22 DIAGNOSIS — M48062 Spinal stenosis, lumbar region with neurogenic claudication: Secondary | ICD-10-CM

## 2023-11-22 NOTE — Assessment & Plan Note (Addendum)
 Chornic LE Leg pain - more neuropathy -  Edema minimal.  Status post varicose vein ablation with no significant swelling. Neuropathy may be partially related to venous congestion. - Continue annual follow-up with vascular surgery.

## 2023-11-22 NOTE — Progress Notes (Signed)
 Cardiology Office Note:  .   Date:  11/27/2023  ID:  Peter [name], DOB 1950-09-05, MRN [id number] PCP: Jarold Omar RAMAN, MD  [facility] Providers Cardiologist:  Alm Clay, MD Cardiology APP:  Emelia Josefa HERO, NP     Chief Complaint  Patient presents with   Follow-up    33-month follow-up.  Doing fairly well.   Coronary Artery Disease    Nonobstructive CAD by coronary CTA.  Blood pressure has been running a little bit low.    Patient Profile: .     Peter [name] is a 73 y.o. male  former smoker with a PMH notable for AAA (s/p Graft Repair), non--obstructive CAD, HTN, DM-2, HLD (statin Myopathy), chronic venous insufficiency, secondary Polycythemia who presents here for 56-month follow-up at the request of Jarold Omar RAMAN, MD.  PMH: AAA-s/p graft repair Nonobstructive CAD by Coronary CTA January 2024.  CAC score 1317.  Mild calcified proximal LAD, proximal LCx and proximal RCA (25-49%).  Unremarkable FFR ct HTN HLD with statin myopathy Chronic venous insufficiency/bilateral varicose veins Spinal stenosis (L4-L5) Secondary polycythemia     Peter [name] was last seen on February 21, 2023 with a follow-up echocardiogram.  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  He is following his PCP daily doing well.  He was taking his olmesartan  as needed for BP only and was taking carvedilol  6.25 mg twice daily.  He noted this time he is breathing somewhat better.  Recommended increased physical activity.  At this time he was on carvedilol  and Repatha .  Only using olmesartan  as needed.  He was noting some weird nervous sensation in his abdomen that may have been palpitations.  Only lasting 45 seconds.  Deferred monitor.  Subjective  Discussed the use of AI scribe software for clinical note transcription with the patient, who gave verbal consent to proceed.  History of Present  Illness Peter [name] is a 73 year old male with hypertension and coronary artery disease who presents for follow-up of his cardiovascular health.  He is not taking olmesartan  20 mg daily but continues on carvedilol  6.25 mg daily, Ozempic for four years, aspirin 81 mg, and Repatha  injections. He experiences occasional low blood pressure readings, with systolic values sometimes dropping below 100, leading to dizziness upon standing.  He has a history of coronary artery disease with a coronary calcium score of 1317 and mild to moderate plaque in his coronary arteries. A coronary CT angiogram in January 2024 showed no significant blockages. He is on Repatha  for cholesterol management, with recent labs showing total cholesterol of 130, triglycerides of 202, HDL of 31, and LDL of 65.  He experiences neuropathy in his feet and legs, which limits his ability to walk and perform activities such as taking care of cows. He finds comfort in reclining and experiences pain when standing still. He is on gabapentin for neuropathy management.  He reports occasional shortness of breath at night, requiring deep breaths. No chest pain, leg swelling, or orthopnea. He experiences dizziness when standing up, which he attributes to low blood pressure.  He has a history of varicose vein ablation and follows up with a vascular surgeon annually. He also has a history of an aortic aneurysm, which is monitored regularly.  Cardiovascular ROS: positive for - edema and mild leg swelling but mostly neuropathy that limits his walking.  He really denies  exertional dyspnea just the effort with neuropathy makes his breathing and will be difficult.  Mild dyspnea with lying down but not really orthopnea or PND.  Orthostatic dizziness. negative for - chest pain, irregular heartbeat, orthopnea, palpitations, paroxysmal nocturnal dyspnea, rapid heart rate, shortness of breath, or syncope or near syncope, TIA or amaurosis fugax,  claudication.  Melena, hematochezia, hematuria or epistaxis.  ROS:  Review of Systems - Negative except symptoms noted above.    Objective   Pertinent Cardiovascular Medications: Carvedilol  6.25 mg twice daily, Ozempic 1 mg weekly, co-Q10 20 mg daily, aspirin 81 mg daily, Repatha  140 mg q. 14 days.  Had been taking olmesartan  20 mg but only as needed.  Studies Reviewed: Peter [name]       EKG today: Sinus bradycardia-58 bpm.  Minimal voltage for LVH with repolarization (J-point) abnormality.  As compared to EKG from 11/12/2022-RBBB no longer present.  Rate slower by 14 beats.  Repolarization changes of RBB also no longer present.  Results LABS Lipid Panel: Total cholesterol 130, triglycerides 202, HDL 31, LDL 65 (06/15/2023) CBC/Chemistry: WBC 7.9, Hgb 18.8, PLT 212; BUN 17, Cr 1.9, NA 136, K4.9, CL 97, calcium 9.8, ALT 20, AST 19, AP 90.  A1c 6.2  RADIOLOGY Coronary CT Angiogram: Coronary calcium score 1317, mild to moderate mixed plaque 25-49% in three major arteries, no significant blockage-CT FFR negative (01/2022)  DIAGNOSTIC Echocardiogram: Normal LV function with EF 65-70%, mild LVH, mild diastolic dysfunction, G1 DD.  Mildly elevated PAP estimated 38 mmHg with mildly elevated RAP.  Moderate AoV calcification-sclerosis without stenosis.(12/2022)   Risk Assessment/Calculations:           Physical Exam:   VS:  BP 104/60 (Patient Position: Sitting, Cuff Size: Normal)   Pulse (!) 58   Ht 6' 2 (1.88 m)   Wt 191 lb 12.8 oz (87 kg)   BMI 24.63 kg/m    Wt Readings from Last 3 Encounters:  11/22/23 191 lb 12.8 oz (87 kg)  11/21/23 190 lb (86.2 kg)  02/21/23 195 lb 9.6 oz (88.7 kg)     GEN: Well nourished, well groomed; in no acute distress; healthy-appearing. NECK: No JVD; No carotid bruits CARDIAC: Normal S1, S2; RRR, no murmurs, rubs, gallops RESPIRATORY:  Clear to auscultation without rales, wheezing or rhonchi ; nonlabored, good air movement. ABDOMEN: Soft, non-tender,  non-distended EXTREMITIES:  No edema; No deformity      ASSESSMENT AND PLAN: .    Problem List Items Addressed This Visit       Cardiology Problems   Aortic aneurysm of unspecified site, without rupture (Chronic)   Per report-s/p AAA repair with graft. Small thoracic aortic aneurysm residual.  Being monitored elsewhere-vascular surgery..  Recommend regular follow-up with vascular surgery. - Scheduled annual follow-up with vascular surgery.      Coronary artery disease involving native coronary artery without angina pectoris - Primary (Chronic)   Coronary calcium score of 1317 with mild to moderate mixed plaque in three major arteries (25-49% stenosis). No significant stenoses by CT FFR.  Echocardiogram shows normal ejection fraction (65-70%) with mild thickening and abnormal relaxation.  Not complaining of any anginal symptoms. - Continue Repatha  for cholesterol management, along with Ozempic for diabetes control - Continue aspirin 81 mg daily based on the extent of calcium -Continue low-dose carvedilol  6.25 mg twice daily, but okay to stop ARB based on low blood pressures. - Encouraged regular physical activity as tolerated.      Relevant Orders   EKG 12-Lead (Completed)  Ambulatory referral to Vascular Surgery   Essential hypertension (Chronic)   Blood pressure ranges from low 90s to 110s. Episodes of hypotension with systolic readings below 100. Currently on carvedilol  6.25 mg daily. Not taking olmesartan  due to hypotension concerns. Advised to monitor blood pressure and adjust carvedilol  dosing based on readings. - Continue carvedilol  6.25 mg daily, take with meals. - No longer really on telmisartan.  Can hold for now. - Increase hydration, especially in the morning and before bed. - Monitor blood pressure, especially if feeling dizzy or weak. - If systolic blood pressure is below 100, hold the next dose of carvedilol .        Hyperlipidemia associated with type 2  diabetes mellitus (HCC) (Chronic)   Lipid panel well-controlled with an LDL of 65 on Repatha . - Continue Repatha  140 mg injections every 2 weeks  Blood sugar levels fluctuate with occasional hypoglycemia. Currently on Ozempic for management.  Most recent A1c 6.2. - Continue Ozempic for diabetes management.      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  Status post varicose vein ablation with no significant swelling. Neuropathy may be partially related to venous congestion. - Continue annual follow-up with vascular surgery.      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 Gabapentin is being used for management. Pain exacerbated by standing and possibly related to previous back surgery and venous congestion. - Continue gabapentin for neuropathy management. - Encouraged walking as tolerated.      Spinal stenosis at L4-L5 level (Chronic)   Probably has some neural pseudo-claudication symptoms that really limits his walking activities  .Lumbar spinal stenosis with neurogenic claudication Neuropathy in legs possibly related to lumbar spinal stenosis. Previous back surgery and injection provided relief. - Continue current management and monitor symptoms.      Statin myopathy (Chronic)   Intolerant of multiple statins with myalgias and arthralgias as well as mid memory issues. Remains on co-Q10 and tolerating Repatha  well with well-controlled lipids.      Other Visit Diagnoses       Palpitations       Relevant Orders   EKG 12-Lead (Completed)             Follow-Up: Return in about 1 year (around 11/21/2024) for 1 Yr Follow-up, Alternating annual follow-ups APP and MD, [facility].  I spent 46 minutes in the care of CREIGHTON LONGLEY today including reviewing labs (1 minute), reviewing outside labs from [facility] data (1 minute), reviewing studies (coronary CTA and echocardiogram reviewed-4 minutes), face to face time discussing treatment options (25 minutes), reviewing records from previous notes, vascular surgery, APP notes (6 minutes), 9 minutes dictating, and documenting in the encounter.      Signed, Alm MICAEL Clay, MD, MS Alm Clay, M.D., M.S. Interventional Cardiologist  [facility] Pager # [phone]

## 2023-11-22 NOTE — Patient Instructions (Signed)
 Medication Instructions:   STOP TAKING OLMESARTAN    IT IS OKAY TO HOLD COREG  (CARVEDILOL  ) IF YOU ARE DIZZY AND /OR B/P IS LOWER THAN 100 (THE TOP NUMBER) *If you need a refill on your cardiac medications before your next appointment, please call your pharmacy*   Lab Work: NOT NEEDED If you have labs (blood work) drawn today and your tests are completely normal, you will receive your results only by: MyChart Message (if you have MyChart) OR A paper copy in the mail If you have any lab test that is abnormal or we need to change your treatment, we will call you to review the results.   Testing/Procedures:  NOT NEEDED  Follow-Up: At [facility], you and your health needs are our priority.  As part of our continuing mission to provide you with exceptional heart care, we have created designated Provider Care Teams.  These Care Teams include your primary Cardiologist (physician) and Advanced Practice Providers (APPs -  Physician Assistants and Nurse Practitioners) who all work together to provide you with the care you need, when you need it.     Your next appointment:   1 year(s)  The format for your next appointment:   In Person  Provider:   Alm Clay, MD or Josefa Beauvais, NP        Your physician recommends that you schedule a follow-up appointment in: DR BRABHAM-- 2 WEEK -TO 1 MONTH  Other Instructions

## 2023-11-25 ENCOUNTER — Ambulatory Visit: Payer: Self-pay | Admitting: Pulmonary Disease

## 2023-11-27 ENCOUNTER — Encounter: Payer: Self-pay | Admitting: Cardiology

## 2023-11-27 DIAGNOSIS — I719 Aortic aneurysm of unspecified site, without rupture: Secondary | ICD-10-CM | POA: Insufficient documentation

## 2023-11-27 DIAGNOSIS — E1142 Type 2 diabetes mellitus with diabetic polyneuropathy: Secondary | ICD-10-CM | POA: Insufficient documentation

## 2023-11-27 NOTE — Assessment & Plan Note (Signed)
 Per report-s/p AAA repair with graft. Small thoracic aortic aneurysm residual.  Being monitored elsewhere-vascular surgery..  Recommend regular follow-up with vascular surgery. - Scheduled annual follow-up with vascular surgery.

## 2023-11-27 NOTE — Assessment & Plan Note (Signed)
 Intolerant of multiple statins with myalgias and arthralgias as well as mid memory issues. Remains on co-Q10 and tolerating Repatha  well with well-controlled lipids.

## 2023-11-27 NOTE — Assessment & Plan Note (Addendum)
 Probably has some neural pseudo-claudication symptoms that really limits his walking activities  .Lumbar spinal stenosis with neurogenic claudication Neuropathy in legs possibly related to lumbar spinal stenosis. Previous back surgery and injection provided relief. - Continue current management and monitor symptoms.

## 2023-11-27 NOTE — Assessment & Plan Note (Signed)
 Coronary calcium score of 1317 with mild to moderate mixed plaque in three major arteries (25-49% stenosis). No significant stenoses by CT FFR.  Echocardiogram shows normal ejection fraction (65-70%) with mild thickening and abnormal relaxation.  Not complaining of any anginal symptoms. - Continue Repatha  for cholesterol management, along with Ozempic for diabetes control - Continue aspirin 81 mg daily based on the extent of calcium -Continue low-dose carvedilol  6.25 mg twice daily, but okay to stop ARB based on low blood pressures. - Encouraged regular physical activity as tolerated.

## 2023-11-27 NOTE — Assessment & Plan Note (Addendum)
 Lipid panel well-controlled with an LDL of 65 on Repatha . - Continue Repatha  140 mg injections every 2 weeks  Blood sugar levels fluctuate with occasional hypoglycemia. Currently on Ozempic for management.  Most recent A1c 6.2. - Continue Ozempic for diabetes management.

## 2023-11-27 NOTE — Assessment & Plan Note (Signed)
 Neuropathy in feet and legs causing pain, especially when standing. Gabapentin is being used for management. Pain exacerbated by standing and possibly related to previous back surgery and venous congestion. - Continue gabapentin for neuropathy management. - Encouraged walking as tolerated.

## 2023-11-27 NOTE — Assessment & Plan Note (Signed)
 Blood pressure ranges from low 90s to 110s. Episodes of hypotension with systolic readings below 100. Currently on carvedilol  6.25 mg daily. Not taking olmesartan  due to hypotension concerns. Advised to monitor blood pressure and adjust carvedilol  dosing based on readings. - Continue carvedilol  6.25 mg daily, take with meals. - No longer really on telmisartan.  Can hold for now. - Increase hydration, especially in the morning and before bed. - Monitor blood pressure, especially if feeling dizzy or weak. - If systolic blood pressure is below 100, hold the next dose of carvedilol .

## 2023-11-29 ENCOUNTER — Other Ambulatory Visit (HOSPITAL_COMMUNITY): Payer: Self-pay

## 2023-12-30 ENCOUNTER — Other Ambulatory Visit: Payer: Self-pay | Admitting: Cardiology

## 2024-01-13 ENCOUNTER — Other Ambulatory Visit: Payer: Self-pay

## 2024-01-13 DIAGNOSIS — I7143 Infrarenal abdominal aortic aneurysm, without rupture: Secondary | ICD-10-CM

## 2024-02-10 ENCOUNTER — Ambulatory Visit

## 2024-02-10 ENCOUNTER — Ambulatory Visit (HOSPITAL_COMMUNITY)

## 2024-04-06 ENCOUNTER — Ambulatory Visit

## 2024-04-06 ENCOUNTER — Ambulatory Visit (HOSPITAL_COMMUNITY)

## 2024-11-06 ENCOUNTER — Other Ambulatory Visit
